# Patient Record
Sex: Female | Born: 1960 | Race: Black or African American | Hispanic: No | Smoking: Former smoker
Health system: Southern US, Community
[De-identification: ages and names within clinical notes are randomized; demographics above are authoritative.]

## PROBLEM LIST (undated history)

## (undated) DIAGNOSIS — N200 Calculus of kidney: Secondary | ICD-10-CM

## (undated) DIAGNOSIS — R739 Hyperglycemia, unspecified: Secondary | ICD-10-CM

## (undated) DIAGNOSIS — I493 Ventricular premature depolarization: Secondary | ICD-10-CM

## (undated) DIAGNOSIS — E119 Type 2 diabetes mellitus without complications: Secondary | ICD-10-CM

## (undated) DIAGNOSIS — I10 Essential (primary) hypertension: Secondary | ICD-10-CM

## (undated) DIAGNOSIS — R87619 Unspecified abnormal cytological findings in specimens from cervix uteri: Secondary | ICD-10-CM

## (undated) DIAGNOSIS — K219 Gastro-esophageal reflux disease without esophagitis: Secondary | ICD-10-CM

## (undated) DIAGNOSIS — Z87442 Personal history of urinary calculi: Secondary | ICD-10-CM

## (undated) DIAGNOSIS — Z720 Tobacco use: Secondary | ICD-10-CM

## (undated) DIAGNOSIS — I471 Supraventricular tachycardia: Secondary | ICD-10-CM

## (undated) HISTORY — DX: Gastro-esophageal reflux disease without esophagitis: K21.9

## (undated) HISTORY — DX: Essential (primary) hypertension: I10

## (undated) HISTORY — DX: Tobacco use: Z72.0

## (undated) HISTORY — DX: Calculus of kidney: N20.0

## (undated) HISTORY — PX: ABLATION: SHX5711

## (undated) HISTORY — DX: Hyperglycemia, unspecified: R73.9

## (undated) HISTORY — DX: Supraventricular tachycardia: I47.1

## (undated) HISTORY — DX: Unspecified abnormal cytological findings in specimens from cervix uteri: R87.619

## (undated) HISTORY — DX: Ventricular premature depolarization: I49.3

---

## 1998-04-16 ENCOUNTER — Other Ambulatory Visit: Admission: RE | Admit: 1998-04-16 | Discharge: 1998-04-16 | Payer: Self-pay | Admitting: Obstetrics and Gynecology

## 1999-05-06 ENCOUNTER — Other Ambulatory Visit: Admission: RE | Admit: 1999-05-06 | Discharge: 1999-05-06 | Payer: Self-pay | Admitting: Obstetrics and Gynecology

## 2002-01-31 ENCOUNTER — Encounter: Admission: RE | Admit: 2002-01-31 | Discharge: 2002-01-31 | Payer: Self-pay | Admitting: Family Medicine

## 2002-07-11 ENCOUNTER — Encounter: Admission: RE | Admit: 2002-07-11 | Discharge: 2002-07-11 | Payer: Self-pay | Admitting: Family Medicine

## 2002-07-11 ENCOUNTER — Other Ambulatory Visit: Admission: RE | Admit: 2002-07-11 | Discharge: 2002-07-11 | Payer: Self-pay | Admitting: Family Medicine

## 2004-04-04 ENCOUNTER — Ambulatory Visit: Payer: Self-pay | Admitting: Internal Medicine

## 2004-04-11 ENCOUNTER — Ambulatory Visit: Payer: Self-pay | Admitting: Internal Medicine

## 2005-11-12 ENCOUNTER — Ambulatory Visit (HOSPITAL_COMMUNITY): Admission: RE | Admit: 2005-11-12 | Discharge: 2005-11-12 | Payer: Self-pay | Admitting: Obstetrics and Gynecology

## 2006-10-26 ENCOUNTER — Emergency Department (HOSPITAL_COMMUNITY): Admission: EM | Admit: 2006-10-26 | Discharge: 2006-10-26 | Payer: Self-pay | Admitting: *Deleted

## 2010-07-04 NOTE — Op Note (Signed)
NAME:  Raven Weaver, Raven Weaver          ACCOUNT NO.:  [id number]   MEDICAL RECORD NO.:  [id number]          PATIENT TYPE:  AMB   LOCATION:  SDC                           FACILITY:  [facility]   PHYSICIAN:  Malva Limes, M.D.    DATE OF BIRTH:  October 13, 1960   DATE OF PROCEDURE:  11/12/2005  DATE OF DISCHARGE:                                 OPERATIVE REPORT   PREOPERATIVE DIAGNOSIS:  Menorrhagia.   POSTOPERATIVE DIAGNOSIS:  Menorrhagia.   PROCEDURE:  NovaSure endometrial ablation.   SURGEON:  Malva Limes, M.D.   ANESTHESIA:  General.   ANTIBIOTICS:  Ancef 1 g.   ESTIMATED BLOOD LOSS:  Minimal.   SPECIMENS:  None.   DRAINS:  Red rubber catheter to the bladder.   COMPLICATIONS:  None.   PROCEDURE:  The patient was taken to the operating room, and she was placed  in dorsal supine position.  A general anesthetic was administered without  complications.  She was then placed in dorsal lithotomy position.  She was  prepped with Betadine and her bladder drained with a red rubber catheter.  An exam under anesthesia revealed a retroverted uterus of normal size and  shape.  At this point a single-tooth tenaculum was applied to the anterior  cervical lip.  Lidocaine 1% 20 mL was used for paracervical block.  The  cervical os was serially dilated.  The uterus was sounded to 9 cm.  The  cervical length was 3 cm, giving a cavitary length of 6 cm.  At this point  the device was placed and opened.  The width was 4.2 cm.  The seal test was  performed and passed.  The device was then turned on.  The patient tolerated  the procedure well.  The device was removed.  The patient was taken to the  recovery room in stable condition.  Instrument and lap counts were correct  x1.  The patient will be discharged to home.  She will follow up in the  office in 4 weeks, and she will be given Percocet to take p.r.n.           ______________________________  Malva Limes, M.D.     MA/MEDQ  D:  11/12/2005  T:   11/14/2005  Job:  [id number]

## 2011-01-17 DEATH — deceased

## 2014-08-07 ENCOUNTER — Encounter: Payer: Self-pay | Admitting: Family Medicine

## 2014-08-07 ENCOUNTER — Ambulatory Visit (INDEPENDENT_AMBULATORY_CARE_PROVIDER_SITE_OTHER): Payer: Managed Care, Other (non HMO) | Admitting: Family Medicine

## 2014-08-07 VITALS — BP 138/84 | HR 100 | Temp 98.6°F | Wt 173.3 lb

## 2014-08-07 DIAGNOSIS — Z23 Encounter for immunization: Secondary | ICD-10-CM | POA: Diagnosis not present

## 2014-08-07 DIAGNOSIS — K219 Gastro-esophageal reflux disease without esophagitis: Secondary | ICD-10-CM

## 2014-08-07 DIAGNOSIS — Z720 Tobacco use: Secondary | ICD-10-CM

## 2014-08-07 DIAGNOSIS — J302 Other seasonal allergic rhinitis: Secondary | ICD-10-CM

## 2014-08-07 DIAGNOSIS — C189 Malignant neoplasm of colon, unspecified: Secondary | ICD-10-CM | POA: Diagnosis not present

## 2014-08-07 DIAGNOSIS — F17200 Nicotine dependence, unspecified, uncomplicated: Secondary | ICD-10-CM

## 2014-08-07 HISTORY — DX: Other seasonal allergic rhinitis: J30.2

## 2014-08-07 NOTE — Addendum Note (Signed)
Addended by: Agnes Lawrence on: 08/07/2014 03:04 PM   Modules accepted: Orders

## 2014-08-07 NOTE — Patient Instructions (Addendum)
BEFORE YOU LEAVE: -schedule physical with pap in about 3 months - come fasting (can have water and black coffee) -Tdap  Call and schedule mammogram  -We placed a referral for you as discussed for the colonoscopy. It usually takes about 1-2 weeks to process and schedule this referral. If you have not heard from [place] regarding this appointment in 2 weeks please contact our office.  We recommend the following healthy lifestyle measures: - eat a healthy diet consisting of lots of vegetables, fruits, beans, nuts, seeds, healthy meats such as white chicken and fish and whole grains.  - avoid fried foods, fast food, processed foods, sodas, red meet and other fattening foods.  - get a least 150 minutes of aerobic exercise per week.   Please quit smoking

## 2014-08-07 NOTE — Progress Notes (Signed)
Pre visit review using our clinic review tool, if applicable. No additional management support is needed unless otherwise documented below in the visit note. 

## 2014-08-07 NOTE — Progress Notes (Signed)
HPI:  Raven Weaver is here to establish care. Used to see Dr. Burnice Logan but has been > 3 years. Daughter sees me. Mother sees Dr. Raliegh Ip. Last PCP and physical: has not had a physical in a long time. She used to see gyn, but prefers to schedule her pap hear. Hx remote abn pap 25 years ago, all normal since per her report.  Has the following chronic problems that require follow up and concerns today:  Seasonal Allergies: -occ itchy eyes, uses OTC treatments -no hx asthma, allergy shots -reports improving as she gets older  GERD: -rare flares a few times per year -uses OTC prilosec occ -denies: hematochezia, melena, diarrhea, nausea, vomiting, constipation, dysphagia  Tobacco use: - 3-4 cigs per day for off and on for 10 years -denies: hx of cough, SOB, hemoptysis  HM: -colon: agreed to do, referral placed -mammogram: number provided to cal, has been some time -Tdap: will do today  ROS negative for unless reported above: fevers, unintentional weight loss, hearing or vision loss, chest pain, palpitations, struggling to breath, hemoptysis, melena, hematochezia, hematuria, falls, loc, si, thoughts of self harm  Past Medical History  Diagnosis Date  . GERD (gastroesophageal reflux disease)   . Kidney stones     remote, > 30 years ago  . Tobacco use   . Abnormal Pap smear of cervix     remote, > 25 years ago, s/p cone bxs, all normal since per her report  . Hyperglycemia     gestational diabetes    Past Surgical History  Procedure Laterality Date  . Cesarean section    . Ablation      Family History  Problem Relation Age of Onset  . Hypertension Mother   . Diabetes Mother   . Heart disease Mother 43    MI  . Cancer Maternal Aunt   . Diabetes Maternal Grandfather   . Hypertension Maternal Grandfather   . Cancer Paternal Grandmother     History   Social History  . Marital Status: Married    Spouse Name: N/A  . Number of Children: N/A  . Years of  Education: N/A   Social History Main Topics  . Smoking status: Current Every Day Smoker  . Smokeless tobacco: Not on file  . Alcohol Use: No     Comment: 1 glass every 2 weeks  . Drug Use: Not on file  . Sexual Activity: Not on file   Other Topics Concern  . None   Social History Narrative   Work or School: full time for [employer] Situation: lives with mom and kids home during the summer      Spiritual Beliefs: Christian      Lifestyle: walks 2-3 days per week; diet is fair per her report - avoid fried foods - tries to eat veggies and lean meat          No current outpatient prescriptions on file.  EXAM:  Filed Vitals:   08/07/14 1423  BP: 138/84  Pulse: 100  Temp: 98.6 F (37 C)    There is no height on file to calculate BMI.  GENERAL: vitals reviewed and listed above, alert, oriented, appears well hydrated and in no acute distress  HEENT: atraumatic, conjunttiva clear, no obvious abnormalities on inspection of external nose and ears  NECK: no obvious masses on inspection  LUNGS: clear to auscultation bilaterally, no wheezes, rales or rhonchi, good air movement  CV: HRRR, no peripheral  edema  MS: moves all extremities without noticeable abnormality  PSYCH: pleasant and cooperative, no obvious depression or anxiety  ASSESSMENT AND PLAN:  Discussed the following assessment and plan:  Seasonal allergies -seasonal allergies  Colon cancer - Plan: Ambulatory referral to Gastroenterology  Gastroesophageal reflux disease, esophagitis presence not specified -lifestyle changes, prn OTC treatment  Tobacco use disorder  -counseled to quit, information provided  -We reviewed the PMH, PSH, FH, SH, Meds and Allergies. -We provided refills for any medications we will prescribe as needed. -We addressed current concerns per orders and patient instructions. -We have asked for records for pertinent exams, studies, vaccines and notes from previous  providers. -We have advised patient to follow up per instructions below.   -Patient advised to return or notify a doctor immediately if symptoms worsen or persist or new concerns arise.  Patient Instructions  BEFORE YOU LEAVE: -schedule physical with pap in about 3 months - come fasting (can have water and black coffee) -Tdap  Call and schedule mammogram  -We placed a referral for you as discussed for the colonoscopy. It usually takes about 1-2 weeks to process and schedule this referral. If you have not heard from [place] regarding this appointment in 2 weeks please contact our office.  We recommend the following healthy lifestyle measures: - eat a healthy diet consisting of lots of vegetables, fruits, beans, nuts, seeds, healthy meats such as white chicken and fish and whole grains.  - avoid fried foods, fast food, processed foods, sodas, red meet and other fattening foods.  - get a least 150 minutes of aerobic exercise per week.   Please quit smoking     Hicks Feick R.

## 2014-10-25 ENCOUNTER — Encounter: Payer: Self-pay | Admitting: Gastroenterology

## 2014-11-28 ENCOUNTER — Ambulatory Visit (INDEPENDENT_AMBULATORY_CARE_PROVIDER_SITE_OTHER): Payer: Managed Care, Other (non HMO) | Admitting: Family Medicine

## 2014-11-28 ENCOUNTER — Ambulatory Visit (AMBULATORY_SURGERY_CENTER): Payer: Self-pay | Admitting: *Deleted

## 2014-11-28 ENCOUNTER — Encounter: Payer: Self-pay | Admitting: Family Medicine

## 2014-11-28 VITALS — Ht 64.0 in | Wt 178.0 lb

## 2014-11-28 VITALS — BP 122/78 | HR 90 | Temp 98.4°F | Ht 64.0 in | Wt 177.5 lb

## 2014-11-28 DIAGNOSIS — F17200 Nicotine dependence, unspecified, uncomplicated: Secondary | ICD-10-CM | POA: Diagnosis not present

## 2014-11-28 DIAGNOSIS — Z0000 Encounter for general adult medical examination without abnormal findings: Secondary | ICD-10-CM | POA: Diagnosis not present

## 2014-11-28 DIAGNOSIS — Z1211 Encounter for screening for malignant neoplasm of colon: Secondary | ICD-10-CM

## 2014-11-28 DIAGNOSIS — E669 Obesity, unspecified: Secondary | ICD-10-CM

## 2014-11-28 MED ORDER — NA SULFATE-K SULFATE-MG SULF 17.5-3.13-1.6 GM/177ML PO SOLN: ORAL | Status: DC

## 2014-11-28 NOTE — Progress Notes (Signed)
HPI:  Here for CPE:  -Concerns and/or follow up today:   Seasonal Allergies: -occ itchy eyes, uses OTC treatments -no hx asthma, allergy shots -reports improving as she gets older  GERD: -rare flares a few times per year -uses OTC prilosec occ -denies: hematochezia, melena, diarrhea, nausea, vomiting, constipation, dysphagia  Tobacco use: - 3-4 cigs per day for off and on for 10 years -denies: hx of cough, SOB, hemoptysis  -Diet: variety of foods, balance and well rounded  -Exercise: walking 3 days per week  -Taking folic acid, vitamin D or calcium: no  -Diabetes and Dyslipidemia Screening: she had cholesterol and diabetes screening at work today  -Hx of HTN: no - reports bp always normal  -Vaccines: UTD - declined flu shot  -pap history: reports just did pap and gyn physical and mammo with Dr. Benjie Karvonen in gyn 2 months ago  -sexual activity: no  -wants STI testing (Hep C if born [birth year]): no  -FH breast, colon or ovarian ca: see FH Last mammogram:done per her report with Dr. Benjie Karvonen Last colon cancer screening: reports she is schedule for her colonoscopy  Did her breast and womens health exam a few months ago with Dr. Benjie Karvonen   -Alcohol, Tobacco, drug use: see social history  Review of Systems - no fevers, unintentional weight loss, vision loss, hearing loss, chest pain, sob, hemoptysis, melena, hematochezia, hematuria, genital discharge, changing or concerning skin lesions, bleeding, bruising, loc, thoughts of self harm or SI  Past Medical History  Diagnosis Date  . GERD (gastroesophageal reflux disease)   . Kidney stones     remote, > 30 years ago  . Tobacco use   . Abnormal Pap smear of cervix     remote, > 25 years ago, s/p cone bxs, all normal since per her report  . Hyperglycemia     gestational diabetes    Past Surgical History  Procedure Laterality Date  . Cesarean section    . Ablation      Family History  Problem Relation Age of Onset  .  Hypertension Mother   . Diabetes Mother   . Heart disease Mother 26    MI  . Cancer Maternal Aunt   . Diabetes Maternal Grandfather   . Hypertension Maternal Grandfather   . Cancer Paternal Grandmother     Social History   Social History  . Marital Status: Married    Spouse Name: N/A  . Number of Children: N/A  . Years of Education: N/A   Social History Main Topics  . Smoking status: Current Every Day Smoker  . Smokeless tobacco: None  . Alcohol Use: No     Comment: 1 glass every 2 weeks  . Drug Use: None  . Sexual Activity: Not Asked   Other Topics Concern  . None   Social History Narrative   Work or School: full time for [employer] Situation: lives with mom and kids home during the summer      Spiritual Beliefs: Christian      Lifestyle: walks 2-3 days per week; diet is fair per her report - avoid fried foods - tries to eat veggies and lean meat          No current outpatient prescriptions on file.  EXAMDanley Danker Vitals:   11/28/14 0849  BP: 122/78  Pulse: 90  Temp: 98.4 F (36.9 C)   Body mass index is 30.45 kg/(m^2).  GENERAL: vitals reviewed and listed below, alert,  oriented, appears well hydrated and in no acute distress  HEENT: head atraumatic, PERRLA, normal appearance of eyes, ears, nose and mouth. moist mucus membranes.  NECK: supple, no masses or lymphadenopathy  LUNGS: clear to auscultation bilaterally, no rales, rhonchi or wheeze  CV: HRRR, no peripheral edema or cyanosis, normal pedal pulses  BREAST: declined  ABDOMEN: bowel sounds normal, soft, non tender to palpation, no masses, no rebound or guarding  GU: declined  SKIN: no rash or abnormal lesions  MS: normal gait, moves all extremities normally  NEURO: CN II-XII grossly intact, normal muscle strength and sensation to light touch on extremities  PSYCH: normal affect, pleasant and cooperative  ASSESSMENT AND PLAN:  Discussed the following assessment and  plan:   Visit for preventive health examination  Tobacco use disorder -counseled 3-5 minutes  Obesity -counseled, lifestyle recs  -she reports colonoscopy already scheduled  -Discussed and advised all [place] preventive services health task force level A and B recommendations for age, sex and risks.  -Advised at least 150 minutes of exercise per week and a healthy diet low in saturated fats and sweets and consisting of fresh fruits and vegetables, lean meats such as fish and white chicken and whole grains.  -labs, studies and vaccines per orders this encounter  No orders of the defined types were placed in this encounter.    Patient advised to return to clinic immediately if symptoms worsen or persist or new concerns.  Patient Instructions  BEFORE YOU LEAVE: -schedule follow up in 6 months  Please fax [place] a copy of your labwork  Please quit smoking - call the quitline and try nicotine lozenges or gum for cravings  We recommend the following healthy lifestyle measures: - eat a healthy whole foods diet consisting of regular small meals composed of vegetables, fruits, beans, nuts, seeds, healthy meats such as white chicken and fish and whole grains.  - avoid sweets, white starchy foods, fried foods, fast food, processed foods, sodas, red meet and other fattening foods.  - get a least 150-300 minutes of aerobic exercise per week.      Return in about 1 year (around 11/28/2015).  Colin Benton R.

## 2014-11-28 NOTE — Patient Instructions (Signed)
BEFORE YOU LEAVE: -schedule follow up in 6 months  Please fax [place] a copy of your labwork  Please quit smoking - call the quitline and try nicotine lozenges or gum for cravings  We recommend the following healthy lifestyle measures: - eat a healthy whole foods diet consisting of regular small meals composed of vegetables, fruits, beans, nuts, seeds, healthy meats such as white chicken and fish and whole grains.  - avoid sweets, white starchy foods, fried foods, fast food, processed foods, sodas, red meet and other fattening foods.  - get a least 150-300 minutes of aerobic exercise per week.

## 2014-11-28 NOTE — Progress Notes (Signed)
Pre visit review using our clinic review tool, if applicable. No additional management support is needed unless otherwise documented below in the visit note. 

## 2014-11-28 NOTE — Progress Notes (Signed)
Patient denies any allergies to eggs or soy. Patient denies any problems with anesthesia/sedation. Patient denies any oxygen use at home and does not take any diet/weight loss medications. EMMI education assisgned to patient on colonoscopy, this was explained and instructions given to patient. 

## 2014-12-12 ENCOUNTER — Ambulatory Visit: Payer: Managed Care, Other (non HMO) | Admitting: Gastroenterology

## 2014-12-12 ENCOUNTER — Encounter: Payer: Self-pay | Admitting: Family Medicine

## 2014-12-12 ENCOUNTER — Ambulatory Visit (INDEPENDENT_AMBULATORY_CARE_PROVIDER_SITE_OTHER): Payer: Managed Care, Other (non HMO) | Admitting: Family Medicine

## 2014-12-12 ENCOUNTER — Encounter: Payer: Self-pay | Admitting: Gastroenterology

## 2014-12-12 VITALS — BP 141/68 | HR 93 | Temp 97.6°F | Ht 64.0 in | Wt 178.0 lb

## 2014-12-12 VITALS — BP 133/76 | HR 60 | Temp 98.8°F | Ht 64.0 in | Wt 177.0 lb

## 2014-12-12 DIAGNOSIS — I499 Cardiac arrhythmia, unspecified: Secondary | ICD-10-CM

## 2014-12-12 DIAGNOSIS — Z1211 Encounter for screening for malignant neoplasm of colon: Secondary | ICD-10-CM

## 2014-12-12 LAB — BASIC METABOLIC PANEL WITH GFR
BUN: 10 mg/dL (ref 6–23)
CO2: 30 meq/L (ref 19–32)
Calcium: 10 mg/dL (ref 8.4–10.5)
Chloride: 106 meq/L (ref 96–112)
Creatinine, Ser: 0.88 mg/dL (ref 0.40–1.20)
GFR: 85.55 mL/min
Glucose, Bld: 168 mg/dL — ABNORMAL HIGH (ref 70–99)
Potassium: 4.3 meq/L (ref 3.5–5.1)
Sodium: 143 meq/L (ref 135–145)

## 2014-12-12 LAB — CBC WITH DIFFERENTIAL/PLATELET
Basophils Absolute: 0 10*3/uL (ref 0.0–0.1)
Basophils Relative: 0.2 % (ref 0.0–3.0)
Eosinophils Absolute: 0.2 10*3/uL (ref 0.0–0.7)
Eosinophils Relative: 1.2 % (ref 0.0–5.0)
HCT: 45.8 % (ref 36.0–46.0)
Hemoglobin: 15.3 g/dL — ABNORMAL HIGH (ref 12.0–15.0)
Lymphocytes Relative: 40.7 % (ref 12.0–46.0)
Lymphs Abs: 5.7 10*3/uL — ABNORMAL HIGH (ref 0.7–4.0)
MCHC: 33.4 g/dL (ref 30.0–36.0)
MCV: 90.9 fl (ref 78.0–100.0)
Monocytes Absolute: 0.9 10*3/uL (ref 0.1–1.0)
Monocytes Relative: 6.2 % (ref 3.0–12.0)
Neutro Abs: 7.2 10*3/uL (ref 1.4–7.7)
Neutrophils Relative %: 51.7 % (ref 43.0–77.0)
Platelets: 322 10*3/uL (ref 150.0–400.0)
RBC: 5.04 Mil/uL (ref 3.87–5.11)
RDW: 13.8 % (ref 11.5–15.5)
WBC: 13.9 10*3/uL — ABNORMAL HIGH (ref 4.0–10.5)

## 2014-12-12 LAB — TSH: TSH: 0.58 u[IU]/mL (ref 0.35–4.50)

## 2014-12-12 LAB — MAGNESIUM: Magnesium: 2.3 mg/dL (ref 1.5–2.5)

## 2014-12-12 MED ORDER — SODIUM CHLORIDE 0.9 % IV SOLN: 500.0000 mL | INTRAVENOUS | Status: DC

## 2014-12-12 NOTE — Progress Notes (Signed)
   Subjective:    Patient ID: Raven Weaver, female    DOB: May 08, 1960, 54 y.o.   MRN: [id number]  HPI Here at the request of the GI clinic to recheck an irregular heartbeat. She has no hx of heart problems and in fact was here to see Dr. Maudie Mercury just 2 weeks ago with a normal heart exam. She denies any symptoms like chest pain or SOB or palpitations. She was planned to have a colonoscopy this am, and she went NPO for 24 hours from Monday night to Tuesday night. Then last night she took the oral prep as prescribed. She felt fine when she got to the GI clinic this am, but the staff noticed that she had an irregular heartbeat. An EKG confirmed that she was in trigeminy with frequent ectopy. The procedure was cancelled, and she was sent to see [place]. Right now she still feels fine and has no symptoms at all.    Review of Systems  Constitutional: Negative.   Respiratory: Negative.   Cardiovascular: Negative.   Endocrine: Negative.   Neurological: Negative.        Objective:   Physical Exam  Constitutional: She is oriented to person, place, and time. She appears well-developed and well-nourished. No distress.  Neck: No thyromegaly present.  Cardiovascular: Normal rate, normal heart sounds and intact distal pulses.  Exam reveals no gallop and no friction rub.   No murmur heard. Irregular with frequent ectopy . EKG still shows trigeminy   Pulmonary/Chest: Effort normal and breath sounds normal. No respiratory distress. She has no wheezes. She has no rales.  Lymphadenopathy:    She has no cervical adenopathy.  Neurological: She is alert and oriented to person, place, and time.          Assessment & Plan:  New onset of trigeminy in an asymptomatic patient. It is highly likely that her pre-colonoscopy prep has caused an electolyte imbalance (probably a low potassium) and this has precipitated the arrythmia. She will now have labs drawn including a BMET. I advised her to go home to rest and to  drink plenty of Gatorade. We will refer her to Cardiology as well.

## 2014-12-12 NOTE — Progress Notes (Signed)
Pre visit review using our clinic review tool, if applicable. No additional management support is needed unless otherwise documented below in the visit note. 

## 2014-12-12 NOTE — Progress Notes (Signed)
Patient here for screening colonoscopy today. During her check-in / pre-op she was noted to be in ventricular trigeminy with frequent PVCs. She is asymptomatic. No chest pain or palpations, no lightheadedness. She denies a history of PVCs or arrhythmias. Discussed with anesthesia. Recommend she have this evaluated by primary care / cardiology prior to proceeding with anesthesia to avoid precipitation of an arrhythmia. The patient's PCM was contacted and she will be seen today for this issue. She will be rescheduled to have this done at another time when this issue has been evaluated. She agreed with the plan.

## 2014-12-12 NOTE — Progress Notes (Signed)
Patient denies any allergies to eggs or soy. 

## 2014-12-12 NOTE — Progress Notes (Signed)
Late entry:  Pt noted to have ventricular trigeminy upon admission to the [facility].  She denies chest pain, SOB, feelings of palpitations.  No cardiac history noted.  Pt seen by Jeraldine Loots CRNA and Dr. Havery Moros.  It is decided to cancel her colonoscopy, for her to see her PCP to determine if cardiology consult needed and then reschedule procedure at pt's convenience.  Pt is agreeable to this.    appt made with Dr. Sarajane Jews at 2:00.  Pt and mother aware.

## 2014-12-14 ENCOUNTER — Telehealth: Payer: Self-pay | Admitting: Family Medicine

## 2014-12-14 NOTE — Telephone Encounter (Signed)
Pt needs blood work results °

## 2014-12-14 NOTE — Progress Notes (Signed)
Received labs from her work. Hgba1c 7.0, cholesterol ratio 3.3 with LDL 108. Advised assistant to contact pt to schedule appt to recheck and discuss in next 1 month.

## 2014-12-14 NOTE — Telephone Encounter (Signed)
Patient informed of the results and appt scheduled for 11/22.

## 2014-12-14 NOTE — Telephone Encounter (Signed)
Left a message for the pt to return my call.  

## 2014-12-25 NOTE — Progress Notes (Signed)
Cardiology Office Note   Date:  12/26/2014   ID:  DEYJAH KINDEL, DOB [birth year], MRN [id number]  PCP:  [name]., DO  Cardiologist:   Sharol Harness, MD   Chief Complaint  Patient presents with  . New Evaluation    irregular heartbeat  . Chest Pain    pt c/o having some palpatatiions 2 weeks ago and some tingling in fingers  . Shortness of Breath    no SOB  . Edema    no edema      History of Present Illness: SANTANA GOSDIN is a 54 y.o. female with tobacco abuse and diabetes who presents for an evaluation of palpitations.  Ms. Sobieski presented for colonoscopy on 10/26 where she was noted to have an irregular heart beat.  EKG showed ventricular trigeminy with frequent PVCs.  Her procedure was cancelled and she was referred to cardiology.  She saw her PCP, Dr. Alysia Penna, later that day.  At that time her thyroid function was normal, as was her renal function and electrolytes.  However, her WBC was 16.3 and hemoglobin A1c was 7%.  Prior to this she never knew that she had an irregular heart beat.  She has not noted any palpitations or chest pain.  Since that time she has noted intermittent palpitations. Initially it was sporadic but has has started to be more constant.  The episodes last up to 30 seconds and they are not associated with shortness of breath.  There is no associated lightheadedness or dizziness.   Ms. Byrer has a history of indigestion but has noted increased heart burn over the last couple of days. She has tried taking Tums which helps.  Episodes occur up to multiple times in the day or may not happen for weeks at a time.  She has also noted tingling in the L 4th and 5h digits.    Ms. Fulp walks once or twice per week.  She deneis chest pain, or SOB, diaphoresis with this activity. She also denies lightheadedness.  She reports that her diet is pretty good. She eats good portions and drinks mostly water. She drinks wine 1-2 times per week.  She quit smoking last week.   Past Medical History  Diagnosis Date  . GERD (gastroesophageal reflux disease)   . Kidney stones     remote, > 30 years ago  . Tobacco use   . Abnormal Pap smear of cervix     remote, > 25 years ago, s/p cone bxs, all normal since per her report  . Hyperglycemia     gestational diabetes  . SVT (supraventricular tachycardia) ([place]) 12/26/2014    Past Surgical History  Procedure Laterality Date  . Cesarean section    . Ablation      uterine     Current Outpatient Prescriptions  Medication Sig Dispense Refill  . aspirin EC 81 MG tablet Take 1 tablet (81 mg total) by mouth daily. 90 tablet 3  . esomeprazole (NEXIUM) 40 MG capsule Take 1 capsule (40 mg total) by mouth daily at 12 noon. 30 capsule 6  . nitroGLYCERIN (NITROSTAT) 0.4 MG SL tablet Place 1 tablet (0.4 mg total) under the tongue every 5 (five) minutes as needed for chest pain. 25 tablet 6   No current facility-administered medications for this visit.    Allergies:   Review of patient's allergies indicates no known allergies.    Social History:  The patient  reports that she has been smoking  Cigarettes.  She has been smoking about 0.25 packs per day. She has never used smokeless tobacco. She reports that she drinks alcohol. She reports that she does not use illicit drugs.   Family History:  The patient's family history includes Cancer in her maternal aunt and paternal grandmother; Diabetes in her maternal grandfather and mother; Heart disease (age of onset: 33) in her mother; Hypertension in her maternal grandfather and mother. There is no history of Colon cancer.    ROS:  Please see the history of present illness.   Otherwise, review of systems are positive for none.   All other systems are reviewed and negative.    PHYSICAL EXAM: VS:  BP 152/64 mmHg  Pulse 85  Ht 5\' 4"  (1.626 m)  Wt 80.377 kg (177 lb 3.2 oz)  BMI 30.40 kg/m2  LMP 02/16/2006 , BMI Body mass index is 30.4  kg/(m^2). GENERAL:  Well appearing HEENT:  Pupils equal round and reactive, fundi not visualized, oral mucosa unremarkable NECK:  No jugular venous distention, waveform within normal limits, carotid upstroke brisk and symmetric, no bruits, no thyromegaly LYMPHATICS:  No cervical adenopathy LUNGS:  Clear to auscultation bilaterally HEART: Mostly regular with frequent ectopy.  PMI not displaced or sustained,S1 and S2 within normal limits, no S3, no S4, no clicks, no rubs, no murmurs ABD:  Flat, positive bowel sounds normal in frequency in pitch, no bruits, no rebound, no guarding, no midline pulsatile mass, no hepatomegaly, no splenomegaly EXT:  2 plus pulses throughout, no edema, no cyanosis no clubbing SKIN:  No rashes no nodules NEURO:  Cranial nerves II through XII grossly intact, motor grossly intact throughout PSYCH:  Cognitively intact, oriented to person place and time    EKG:  EKG is not ordered today.   Recent Labs: 12/12/2014: BUN 10; Creatinine, Ser 0.88; Hemoglobin 15.3*; Magnesium 2.3; Platelets 322.0; Potassium 4.3; Sodium 143; TSH 0.58    Lipid Panel No results found for: CHOL, TRIG, HDL, CHOLHDL, VLDL, LDLCALC, LDLDIRECT    Wt Readings from Last 3 Encounters:  12/26/14 80.377 kg (177 lb 3.2 oz)  12/12/14 80.287 kg (177 lb)  12/12/14 80.74 kg (178 lb)      ASSESSMENT AND PLAN:  # PVCs: Ms. Rotenberg PVCs are not particularly troubling to her. She does not have any evidence of heart failure on exam. Her electrolytes are within normal limits. It is concerning that this could represent ischemia, especially in light of the indigestion and chest discomfort that she has been having recently. She is planning to go out of town and cannot undergo an ischemia evaluation this week. We will start her on aspirin and as needed nitroglycerin. We will refer her for an exercise Myoview. In the interim, she will also start omeprazole 20 mg by mouth daily. This will help determine how  much of her symptoms are due to gastroesophageal reflux disease versus potential ischemia.  Finally, we will obtain a 24-hour Holter to quantify the frequency of her PVCs.  # Tobacco abuse: Ms. Khaimov was congratulated on recently quitting smoking. She was encouraged to keep these efforts.  # Elevated blood pressure:  Ms. Milberger blood pressure is elevated today. She reports that it typically is well-controlled, and that it is just elevated because she is nervous and stressed about her heart. We will continue to monitor this and start antihypertensives as necessary.   Current medicines are reviewed at length with the patient today.  The patient does not have concerns regarding medicines.  The  following changes have been made:  no change  Labs/ tests ordered today include:   Orders Placed This Encounter  Procedures  . Holter monitor - 24 hour  . Myocardial Perfusion Imaging     Disposition:   FU with Pepper Kerrick C. [name], MD after stress   Signed, Sharol Harness, MD  12/26/2014 10:25 PM    [place]

## 2014-12-26 ENCOUNTER — Encounter: Payer: Self-pay | Admitting: Cardiovascular Disease

## 2014-12-26 ENCOUNTER — Telehealth: Payer: Self-pay | Admitting: *Deleted

## 2014-12-26 ENCOUNTER — Ambulatory Visit (INDEPENDENT_AMBULATORY_CARE_PROVIDER_SITE_OTHER): Payer: Managed Care, Other (non HMO) | Admitting: Cardiovascular Disease

## 2014-12-26 VITALS — BP 152/64 | HR 85 | Ht 64.0 in | Wt 177.2 lb

## 2014-12-26 DIAGNOSIS — I493 Ventricular premature depolarization: Secondary | ICD-10-CM

## 2014-12-26 DIAGNOSIS — I471 Supraventricular tachycardia, unspecified: Secondary | ICD-10-CM | POA: Insufficient documentation

## 2014-12-26 DIAGNOSIS — R072 Precordial pain: Secondary | ICD-10-CM

## 2014-12-26 DIAGNOSIS — R002 Palpitations: Secondary | ICD-10-CM | POA: Diagnosis not present

## 2014-12-26 HISTORY — DX: Ventricular premature depolarization: I49.3

## 2014-12-26 HISTORY — DX: Supraventricular tachycardia: I47.1

## 2014-12-26 MED ORDER — OMEPRAZOLE 20 MG PO CPDR: 20.0000 mg | DELAYED_RELEASE_CAPSULE | Freq: Every day | ORAL | Status: DC

## 2014-12-26 MED ORDER — NITROGLYCERIN 0.4 MG SL SUBL: 0.4000 mg | SUBLINGUAL_TABLET | SUBLINGUAL | Status: DC | PRN

## 2014-12-26 MED ORDER — ESOMEPRAZOLE MAGNESIUM 40 MG PO CPDR: 40.0000 mg | DELAYED_RELEASE_CAPSULE | Freq: Every day | ORAL | Status: DC

## 2014-12-26 MED ORDER — ASPIRIN EC 81 MG PO TBEC: 81.0000 mg | DELAYED_RELEASE_TABLET | Freq: Every day | ORAL | Status: DC

## 2014-12-26 NOTE — Patient Instructions (Signed)
Start taking  Omeprazole 20 mg one tablet daily in the morning  Aspirin 81 mg enetric coated one tablet daily.  Your physician has requested that you have en exercise stress myoview. For further information please visit [email or web address]. Please follow instruction sheet, as given.  Will place on the day you have myoview.---Your physician has recommended that you wear a holter monitor. Holter monitors are medical devices that record the heart's electrical activity. Doctors most often use these monitors to diagnose arrhythmias. Arrhythmias are problems with the speed or rhythm of the heartbeat. The monitor is a small, portable device. You can wear one while you do your normal daily activities. This is usually used to diagnose what is causing palpitations/syncope (passing out).   Your physician wants you to follow-up after the test are completed.  .Nitroglycerin sublingual tablets What is this medicine? NITROGLYCERIN (nye troe GLI ser in) is a type of vasodilator. It relaxes blood vessels, increasing the blood and oxygen supply to your heart. This medicine is used to relieve chest pain caused by angina. It is also used to prevent chest pain before activities like climbing stairs, going outdoors in cold weather, or sexual activity. This medicine may be used for other purposes; ask your health care provider or pharmacist if you have questions. What should I tell my health care provider before I take this medicine? They need to know if you have any of these conditions: -anemia -head injury, recent stroke, or bleeding in the brain -liver disease -previous heart attack -an unusual or allergic reaction to nitroglycerin, other medicines, foods, dyes, or preservatives -pregnant or trying to get pregnant -breast-feeding How should I use this medicine? Take this medicine by mouth as needed. At the first sign of an angina attack (chest pain or tightness) place one tablet under your tongue. You can also  take this medicine 5 to 10 minutes before an event likely to produce chest pain. Follow the directions on the prescription label. Let the tablet dissolve under the tongue. Do not swallow whole. Replace the dose if you accidentally swallow it. It will help if your mouth is not dry. Saliva around the tablet will help it to dissolve more quickly. Do not eat or drink, smoke or chew tobacco while a tablet is dissolving. If you are not better within 5 minutes after taking ONE dose of nitroglycerin, call 9-1-1 immediately to seek emergency medical care. Do not take more than 3 nitroglycerin tablets over 15 minutes. If you take this medicine often to relieve symptoms of angina, your doctor or health care professional may provide you with different instructions to manage your symptoms. If symptoms do not go away after following these instructions, it is important to call 9-1-1 immediately. Do not take more than 3 nitroglycerin tablets over 15 minutes. Talk to your pediatrician regarding the use of this medicine in children. Special care may be needed. Overdosage: If you think you have taken too much of this medicine contact a poison control center or emergency room at once. NOTE: This medicine is only for you. Do not share this medicine with others. What if I miss a dose? This does not apply. This medicine is only used as needed. What may interact with this medicine? Do not take this medicine with any of the following medications: -certain migraine medicines like ergotamine and dihydroergotamine (DHE) -medicines used to treat erectile dysfunction like sildenafil, tadalafil, and vardenafil -riociguat This medicine may also interact with the following medications: -alteplase -aspirin -heparin -medicines for high blood  pressure -medicines for mental depression -other medicines used to treat angina -phenothiazines like chlorpromazine, mesoridazine, prochlorperazine, thioridazine This list may not describe all  possible interactions. Give your health care provider a list of all the medicines, herbs, non-prescription drugs, or dietary supplements you use. Also tell them if you smoke, drink alcohol, or use illegal drugs. Some items may interact with your medicine. What should I watch for while using this medicine? Tell your doctor or health care professional if you feel your medicine is no longer working. Keep this medicine with you at all times. Sit or lie down when you take your medicine to prevent falling if you feel dizzy or faint after using it. Try to remain calm. This will help you to feel better faster. If you feel dizzy, take several deep breaths and lie down with your feet propped up, or bend forward with your head resting between your knees. You may get drowsy or dizzy. Do not drive, use machinery, or do anything that needs mental alertness until you know how this drug affects you. Do not stand or sit up quickly, especially if you are an older patient. This reduces the risk of dizzy or fainting spells. Alcohol can make you more drowsy and dizzy. Avoid alcoholic drinks. Do not treat yourself for coughs, colds, or pain while you are taking this medicine without asking your doctor or health care professional for advice. Some ingredients may increase your blood pressure. What side effects may I notice from receiving this medicine? Side effects that you should report to your doctor or health care professional as soon as possible: -blurred vision -dry mouth -skin rash -sweating -the feeling of extreme pressure in the head -unusually weak or tired Side effects that usually do not require medical attention (report to your doctor or health care professional if they continue or are bothersome): -flushing of the face or neck -headache -irregular heartbeat, palpitations -nausea, vomiting This list may not describe all possible side effects. Call your doctor for medical advice about side effects. You may  report side effects to FDA at 1-800-FDA-1088. Where should I keep my medicine? Keep out of the reach of children. Store at room temperature between 20 and 25 degrees C (68 and 77 degrees F). Store in [facility] [employer]. Protect from light and moisture. Keep tightly closed. Throw away any unused medicine after the expiration date. NOTE: This sheet is a summary. It may not cover all possible information. If you have questions about this medicine, talk to your doctor, pharmacist, or health care provider.    2016, [employer]. (2012-12-01 17:57:36)

## 2014-12-26 NOTE — Telephone Encounter (Signed)
Patient called omeprazole is not  On formulary  Per Dr [name], MAY SWITCH TO NEXIUM 40 MG ONE DAILY.  PRESCRIPTION CALLED INTO PHARMACY\\PATIENT AWARE.

## 2015-01-08 ENCOUNTER — Ambulatory Visit (INDEPENDENT_AMBULATORY_CARE_PROVIDER_SITE_OTHER): Payer: Managed Care, Other (non HMO) | Admitting: Family Medicine

## 2015-01-08 ENCOUNTER — Encounter: Payer: Self-pay | Admitting: Family Medicine

## 2015-01-08 VITALS — BP 126/80 | HR 80 | Temp 98.6°F | Ht 64.0 in | Wt 176.7 lb

## 2015-01-08 DIAGNOSIS — E119 Type 2 diabetes mellitus without complications: Secondary | ICD-10-CM

## 2015-01-08 DIAGNOSIS — R030 Elevated blood-pressure reading, without diagnosis of hypertension: Secondary | ICD-10-CM | POA: Diagnosis not present

## 2015-01-08 DIAGNOSIS — E785 Hyperlipidemia, unspecified: Secondary | ICD-10-CM | POA: Diagnosis not present

## 2015-01-08 DIAGNOSIS — IMO0001 Reserved for inherently not codable concepts without codable children: Secondary | ICD-10-CM

## 2015-01-08 NOTE — Patient Instructions (Signed)
BEFORE YOU LEAVE: -schedule follow up in 3-4 months, come fasting  We recommend the following healthy lifestyle measures: - eat a healthy whole foods diet consisting of regular small meals composed of vegetables, fruits, beans, nuts, seeds, healthy meats such as white chicken and fish and whole grains.  - avoid sweets, white starchy foods, fried foods, fast food, processed foods, sodas, red meet and other fattening foods.  - get a least 150-300 minutes of aerobic exercise per week.

## 2015-01-08 NOTE — Progress Notes (Signed)
HPI:  Raven Weaver is a pleasant 54 yo whom is here for concerns of elevated Blood Sugar. She saw my collegue recently regarding and abnormal EKG prior to her colonscopy and some blood work was obtained. She saw cardiology last month and is scheduled for a perfusion study and holter. She had labs at work with a hbga1c of 7.0 and elevated cholesterol with LDL 108. She is quite distraught about this and is determined to commit to lifestyle changes and does not wish to take medications. Denies: CP, SOB, DOE, polyuria, polydipsia.  ROS: See pertinent positives and negatives per HPI.  Past Medical History  Diagnosis Date  . GERD (gastroesophageal reflux disease)   . Kidney stones     remote, > 30 years ago  . Tobacco use   . Abnormal Pap smear of cervix     remote, > 25 years ago, s/p cone bxs, all normal since per her report  . Hyperglycemia     gestational diabetes  . SVT (supraventricular tachycardia) ([place]) 12/26/2014  . PVC (premature ventricular contraction) 12/26/2014    Past Surgical History  Procedure Laterality Date  . Cesarean section    . Ablation      uterine    Family History  Problem Relation Age of Onset  . Hypertension Mother   . Diabetes Mother   . Heart disease Mother 43    MI  . CAD Mother   . Cancer Maternal Aunt   . Diabetes Maternal Grandfather   . Hypertension Maternal Grandfather   . Cancer Paternal Grandmother   . Colon cancer Neg Hx     Social History   Social History  . Marital Status: Married    Spouse Name: N/A  . Number of Children: N/A  . Years of Education: N/A   Social History Main Topics  . Smoking status: Former Smoker -- 0.25 packs/day    Types: Cigarettes    Quit date: 12/19/2014  . Smokeless tobacco: Never Used  . Alcohol Use: 0.0 oz/week    0 Standard drinks or equivalent per week     Comment: 1 glass every 2 weeks  . Drug Use: No  . Sexual Activity: Not Asked   Other Topics Concern  . None   Social History  Narrative   Work or School: full time for [employer] Situation: lives with mom and kids home during the summer      Spiritual Beliefs: Christian      Lifestyle: walks 2-3 days per week; diet is fair per her report - avoid fried foods - tries to eat veggies and lean meat           Current outpatient prescriptions:  .  aspirin EC 81 MG tablet, Take 1 tablet (81 mg total) by mouth daily., Disp: 90 tablet, Rfl: 3 .  esomeprazole (NEXIUM) 40 MG capsule, Take 1 capsule (40 mg total) by mouth daily at 12 noon., Disp: 30 capsule, Rfl: 6 .  nitroGLYCERIN (NITROSTAT) 0.4 MG SL tablet, Place 1 tablet (0.4 mg total) under the tongue every 5 (five) minutes as needed for chest pain., Disp: 25 tablet, Rfl: 6  EXAM:  Filed Vitals:   01/08/15 0907  BP: 126/80  Pulse: 80  Temp: 98.6 F (37 C)    Body mass index is 30.32 kg/(m^2).  GENERAL: vitals reviewed and listed above, alert, oriented, appears well hydrated and in no acute distress  HEENT: atraumatic, conjunttiva clear, no obvious abnormalities on inspection  of external nose and ears  NECK: no obvious masses on inspection  LUNGS: clear to auscultation bilaterally, no wheezes, rales or rhonchi, good air movement  CV: HRRR, no peripheral edema  MS: moves all extremities without noticeable abnormality  PSYCH: pleasant and cooperative, no obvious depression or anxiety  ASSESSMENT AND PLAN:  Discussed the following assessment and plan:  Type 2 diabetes mellitus without complication, without long-term current use of insulin (HCC)  Hyperlipemia  Elevated blood pressure  We discussed disease process, dx and treatment at  [facility]. She really wants to treat with lifestyle. Plans to start diet changes today and exercise once evaluation with cardiology complete. We recommend the following healthy lifestyle measures: - eat a healthy whole foods diet consisting of regular small meals composed of vegetables, fruits, beans, nuts, seeds,  healthy meats such as white chicken and fish and small portions whole grains.  - avoid sweets, white starchy foods, fried foods, fast food, processed foods, sodas, red meet and other fattening foods.  - get a least 150-300 minutes of aerobic exercise per week.   -Patient advised to return or notify a doctor immediately if symptoms worsen or persist or new concerns arise.  Patient Instructions  BEFORE YOU LEAVE: -schedule follow up in 3-4 months, come fasting  We recommend the following healthy lifestyle measures: - eat a healthy whole foods diet consisting of regular small meals composed of vegetables, fruits, beans, nuts, seeds, healthy meats such as white chicken and fish and whole grains.  - avoid sweets, white starchy foods, fried foods, fast food, processed foods, sodas, red meet and other fattening foods.  - get a least 150-300 minutes of aerobic exercise per week.       Colin Benton R.

## 2015-01-08 NOTE — Progress Notes (Signed)
Pre visit review using our clinic review tool, if applicable. No additional management support is needed unless otherwise documented below in the visit note. 

## 2015-01-09 ENCOUNTER — Telehealth (HOSPITAL_COMMUNITY): Payer: Self-pay

## 2015-01-09 NOTE — Telephone Encounter (Signed)
Encounter complete. 

## 2015-01-15 ENCOUNTER — Ambulatory Visit: Payer: Managed Care, Other (non HMO) | Admitting: Cardiovascular Disease

## 2015-01-15 ENCOUNTER — Ambulatory Visit (HOSPITAL_COMMUNITY)
Admission: RE | Admit: 2015-01-15 | Discharge: 2015-01-15 | Disposition: A | Discharge status: Home / Self Care | Payer: Managed Care, Other (non HMO) | Source: Ambulatory Visit | Attending: Cardiovascular Disease | Admitting: Cardiovascular Disease

## 2015-01-15 DIAGNOSIS — Z87891 Personal history of nicotine dependence: Secondary | ICD-10-CM | POA: Insufficient documentation

## 2015-01-15 DIAGNOSIS — E669 Obesity, unspecified: Secondary | ICD-10-CM | POA: Insufficient documentation

## 2015-01-15 DIAGNOSIS — Z8249 Family history of ischemic heart disease and other diseases of the circulatory system: Secondary | ICD-10-CM | POA: Insufficient documentation

## 2015-01-15 DIAGNOSIS — I493 Ventricular premature depolarization: Secondary | ICD-10-CM

## 2015-01-15 DIAGNOSIS — Z6830 Body mass index (BMI) 30.0-30.9, adult: Secondary | ICD-10-CM | POA: Insufficient documentation

## 2015-01-15 DIAGNOSIS — R002 Palpitations: Secondary | ICD-10-CM

## 2015-01-15 DIAGNOSIS — R072 Precordial pain: Secondary | ICD-10-CM

## 2015-01-15 LAB — MYOCARDIAL PERFUSION IMAGING
CHL CUP MPHR: 165 {beats}/min
CHL CUP NUCLEAR SDS: 0
CHL RATE OF PERCEIVED EXERTION: 16
CSEPEW: 10.4 METS
CSEPHR: 88 %
CSEPPHR: 146 {beats}/min
Exercise duration (min): 9 min
Rest HR: 100 {beats}/min
SRS: 1
SSS: 1
TID: 1.12

## 2015-01-15 MED ORDER — TECHNETIUM TC 99M SESTAMIBI GENERIC - CARDIOLITE
31.0000 | Freq: Once | INTRAVENOUS | Status: AC | PRN
  Administered 2015-01-15: 31 via INTRAVENOUS

## 2015-01-15 MED ORDER — TECHNETIUM TC 99M SESTAMIBI GENERIC - CARDIOLITE
10.9000 | Freq: Once | INTRAVENOUS | Status: AC | PRN
  Administered 2015-01-15: 10.9 via INTRAVENOUS

## 2015-01-15 MED ORDER — TECHNETIUM TC 99M SESTAMIBI GENERIC - CARDIOLITE
1125.0000 | Freq: Once | INTRAVENOUS | Status: DC | PRN
Start: 2015-01-15 — End: 2015-01-15

## 2015-01-16 ENCOUNTER — Telehealth: Payer: Self-pay | Admitting: *Deleted

## 2015-01-16 NOTE — Telephone Encounter (Signed)
-----   Message from Skeet Latch, MD sent at 01/16/2015  8:11 AM EST ----- normal stress test

## 2015-01-16 NOTE — Telephone Encounter (Signed)
Spoke to patient. Result given . Verbalized understanding  

## 2015-01-31 ENCOUNTER — Ambulatory Visit: Payer: Managed Care, Other (non HMO) | Admitting: Cardiovascular Disease

## 2015-02-14 NOTE — Progress Notes (Signed)
Cardiology Office Note   Date:  02/15/2015   ID:  Raven Weaver, DOB 1960/03/16, MRN [id number]  PCP:  Raven Kern., DO  Cardiologist:   Raven Harness, MD   Chief Complaint  Patient presents with  . Follow-up    NO COMPLAINTS.     Patient ID: Raven Weaver is a 54 y.o. female with diabetes who presents for follow up on PVCs.   Interval History 02/15/15: At her last appointment she was referred for exercise Myoview hat was negative for ischemia. She was noted to have ectopy during the exam. She was also referred for 24-hour Holter but has not yet completed this test.  She was also started on a PPI to determine if her symptoms may also be due to gastroesophageal reflux disease.she states that Nexium did not help her symptoms.  She stopped taking it and switch to over-the-counter Prilosec. Since then, her symptoms have been much better controlled. She has heartburn much less frequently.  [name] denies any chest pain, shortness of breath, lower extremity edema, orthopnea or PND.  She continues to have occasional palpitations. They last for a few minutes at a time. She does not get lightheaded or dizzy when it occurs. There is no associated shortness of breath.   History of Present Illness 12/26/14: [name] presented for colonoscopy on 10/26 where she was noted to have an irregular heart beat.  EKG showed ventricular trigeminy with frequent PVCs.  Her procedure was cancelled and she was referred to cardiology.  She saw her PCP, Dr. Alysia Weaver, later that day.  At that time her thyroid function was normal, as was her renal function and electrolytes.  However, her WBC was 16.3 and hemoglobin A1c was 7%.  Prior to this she never knew that she had an irregular heart beat.  She has not noted any palpitations or chest pain.  Since that time she has noted intermittent palpitations. Initially it was sporadic but has has started to be more constant.  The episodes last up  to 30 seconds and they are not associated with shortness of breath.  There is no associated lightheadedness or dizziness.   [name] has a history of indigestion but has noted increased heart burn over the last couple of days. She has tried taking Tums which helps.  Episodes occur up to multiple times in the day or may not happen for weeks at a time.  She has also noted tingling in the L 4th and 5h digits.    [name] walks once or twice per week.  She deneis chest pain, or SOB, diaphoresis with this activity. She also denies lightheadedness.  She reports that her diet is pretty good. She eats good portions and drinks mostly water. She drinks wine 1-2 times per week. She quit smoking last week.   Past Medical History  Diagnosis Date  . GERD (gastroesophageal reflux disease)   . Kidney stones     remote, > 30 years ago  . Tobacco use   . Abnormal Pap smear of cervix     remote, > 25 years ago, s/p cone bxs, all normal since per her report  . Hyperglycemia     gestational diabetes  . SVT (supraventricular tachycardia) ([place]) 12/26/2014  . PVC (premature ventricular contraction) 12/26/2014    Past Surgical History  Procedure Laterality Date  . Cesarean section    . Ablation      uterine     Current Outpatient  Prescriptions  Medication Sig Dispense Refill  . aspirin EC 81 MG tablet Take 1 tablet (81 mg total) by mouth daily. 90 tablet 3  . nitroGLYCERIN (NITROSTAT) 0.4 MG SL tablet Place 1 tablet (0.4 mg total) under the tongue every 5 (five) minutes as needed for chest pain. 25 tablet 6   No current facility-administered medications for this visit.    Allergies:   Review of patient's allergies indicates no known allergies.    Social History:  The patient  reports that she quit smoking about 8 weeks ago. Her smoking use included Cigarettes. She smoked 0.25 packs per day. She has never used smokeless tobacco. She reports that she drinks alcohol. She reports that she does not  use illicit drugs.   Family History:  The patient's family history includes CAD in her mother; Cancer in her maternal aunt and paternal grandmother; Diabetes in her maternal grandfather and mother; Heart disease (age of onset: 1) in her mother; Hypertension in her maternal grandfather and mother. There is no history of Colon cancer.    ROS:  Please see the history of present illness.   Otherwise, review of systems are positive for none.   All other systems are reviewed and negative.    PHYSICAL EXAM: VS:  BP 132/80 mmHg  Pulse 96  Ht 5\' 4"  (1.626 m)  Wt 81.194 kg (179 lb)  BMI 30.71 kg/m2  LMP 02/16/2006 , BMI Body mass index is 30.71 kg/(m^2). GENERAL:  Well appearing HEENT:  Pupils equal round and reactive, fundi not visualized, oral mucosa unremarkable NECK:  No jugular venous distention, waveform within normal limits, carotid upstroke brisk and symmetric, no bruits, no thyromegaly LYMPHATICS:  No cervical adenopathy LUNGS:  Clear to auscultation bilaterally HEART: Mostly regular with frequent ectopy.  PMI not displaced or sustained,S1 and S2 within normal limits, no S3, no S4, no clicks, no rubs, no murmurs ABD:  Flat, positive bowel sounds normal in frequency in pitch, no bruits, no rebound, no guarding, no midline pulsatile mass, no hepatomegaly, no splenomegaly EXT:  2 plus pulses throughout, no edema, no cyanosis no clubbing SKIN:  No rashes no nodules NEURO:  Cranial nerves II through XII grossly intact, motor grossly intact throughout PSYCH:  Cognitively intact, oriented to person place and time    EKG:  EKG is not ordered today.  Exercise Myoview 01/15/15:     There was no ST segment deviation noted during stress.  This is a low risk study.  Low risk stress nuclear study with a small, mild, fixed apical defect likely related to apical thinning; no ischemia; study not gated due to ventricular ectopy.  Recent Labs: 12/12/2014: BUN 10; Creatinine, Ser 0.88;  Hemoglobin 15.3*; Magnesium 2.3; Platelets 322.0; Potassium 4.3; Sodium 143; TSH 0.58    Lipid Panel No results found for: CHOL, TRIG, HDL, CHOLHDL, VLDL, LDLCALC, LDLDIRECT    Wt Readings from Last 3 Encounters:  02/15/15 81.194 kg (179 lb)  01/15/15 80.287 kg (177 lb)  01/08/15 80.151 kg (176 lb 11.2 oz)      ASSESSMENT AND PLAN:  # PVCs: Ms. Holsten PVCs are not particularly troubling to her. She does not have any evidence of heart failure on exam. Her electrolytes are within normal limits. Stress testing was negative for ischemia.  We will obtain an echocardiogram to evaluate for structural heart disease. She will also have a 24-hour Holter monitor placed today to assess her PVC burden. Based on these findings, we will consider starting a beta blocker.  #  GERD: Improved with PPI.  # Tobacco abuse: Ms. Molock was congratulated continued smoking cessation..  # Elevated blood pressure:  BP well-controlled today.  Current medicines are reviewed at length with the patient today.  The patient does not have concerns regarding medicines.  The following changes have been made:  no change  Labs/ tests ordered today include:   Orders Placed This Encounter  Procedures  . ECHOCARDIOGRAM COMPLETE    Disposition:   FU with Sarahanne Novakowski C. [name], MD in 6 months.   Signed, Raven Harness, MD  02/15/2015 11:23 AM    [facility]

## 2015-02-15 ENCOUNTER — Ambulatory Visit (INDEPENDENT_AMBULATORY_CARE_PROVIDER_SITE_OTHER): Payer: Managed Care, Other (non HMO) | Admitting: Cardiovascular Disease

## 2015-02-15 ENCOUNTER — Encounter: Payer: Self-pay | Admitting: Cardiovascular Disease

## 2015-02-15 VITALS — BP 132/80 | HR 96 | Ht 64.0 in | Wt 179.0 lb

## 2015-02-15 DIAGNOSIS — R030 Elevated blood-pressure reading, without diagnosis of hypertension: Secondary | ICD-10-CM

## 2015-02-15 DIAGNOSIS — IMO0001 Reserved for inherently not codable concepts without codable children: Secondary | ICD-10-CM

## 2015-02-15 DIAGNOSIS — K219 Gastro-esophageal reflux disease without esophagitis: Secondary | ICD-10-CM

## 2015-02-15 DIAGNOSIS — I493 Ventricular premature depolarization: Secondary | ICD-10-CM

## 2015-02-15 NOTE — Patient Instructions (Addendum)
Dr [name] has made no changes today in your current medications or treatment plan.  Your physician has requested that you have an echocardiogram. Echocardiography is a painless test that uses sound waves to create images of your heart. It provides your doctor with information about the size and shape of your heart and how well your heart's chambers and valves are working. This procedure takes approximately one hour. There are no restrictions for this procedure.  Your physician has recommended that you wear a holter monitor. Holter monitors are medical devices that record the heart's electrical activity. Doctors most often use these monitors to diagnose arrhythmias. Arrhythmias are problems with the speed or rhythm of the heartbeat. The monitor is a small, portable device. You can wear one while you do your normal daily activities. This is usually used to diagnose what is causing palpitations/syncope (passing out).  Your physician recommends that you schedule a follow-up appointment in 6 months. You will receive a reminder letter in the mail two months in advance. If you don't receive a letter, please call our office to schedule the follow-up appointment.  If you need a refill on your cardiac medications before your next appointment, please call your pharmacy.

## 2015-02-19 ENCOUNTER — Other Ambulatory Visit: Payer: Self-pay

## 2015-02-19 ENCOUNTER — Ambulatory Visit (HOSPITAL_COMMUNITY): Payer: Managed Care, Other (non HMO) | Attending: Cardiovascular Disease

## 2015-02-19 DIAGNOSIS — I517 Cardiomegaly: Secondary | ICD-10-CM | POA: Insufficient documentation

## 2015-02-19 DIAGNOSIS — I493 Ventricular premature depolarization: Secondary | ICD-10-CM | POA: Insufficient documentation

## 2015-02-19 DIAGNOSIS — E119 Type 2 diabetes mellitus without complications: Secondary | ICD-10-CM | POA: Insufficient documentation

## 2015-02-19 DIAGNOSIS — Z87891 Personal history of nicotine dependence: Secondary | ICD-10-CM | POA: Diagnosis not present

## 2015-02-19 DIAGNOSIS — I351 Nonrheumatic aortic (valve) insufficiency: Secondary | ICD-10-CM | POA: Diagnosis not present

## 2015-02-22 ENCOUNTER — Ambulatory Visit (INDEPENDENT_AMBULATORY_CARE_PROVIDER_SITE_OTHER): Payer: Managed Care, Other (non HMO)

## 2015-02-22 DIAGNOSIS — R072 Precordial pain: Secondary | ICD-10-CM | POA: Diagnosis not present

## 2015-02-22 DIAGNOSIS — I493 Ventricular premature depolarization: Secondary | ICD-10-CM

## 2015-02-22 DIAGNOSIS — R002 Palpitations: Secondary | ICD-10-CM

## 2015-02-25 ENCOUNTER — Telehealth: Payer: Self-pay | Admitting: *Deleted

## 2015-02-25 NOTE — Telephone Encounter (Signed)
-----   Message from Skeet Latch, MD sent at 02/24/2015  4:26 PM EST ----- Mild leaking of the aortic valve.  Otherwise, normal echo.

## 2015-02-25 NOTE — Telephone Encounter (Signed)
Spoke to patient.  Echo Result given . Verbalized understanding  

## 2015-02-25 NOTE — Telephone Encounter (Signed)
Spoke to patient  She will contact office back

## 2015-02-27 ENCOUNTER — Telehealth: Payer: Self-pay | Admitting: *Deleted

## 2015-02-27 MED ORDER — METOPROLOL TARTRATE 25 MG PO TABS: 12.5000 mg | ORAL_TABLET | Freq: Two times a day (BID) | ORAL | Status: DC

## 2015-02-27 NOTE — Telephone Encounter (Signed)
LEFT MESSAGE TO CALL BACK

## 2015-02-27 NOTE — Telephone Encounter (Signed)
Spoke to patient. Result given . Verbalized understanding Appointment schedule for 04/08/15. E-sent metropolol to pharmacy

## 2015-02-27 NOTE — Telephone Encounter (Signed)
-----   Message from Skeet Latch, MD sent at 02/26/2015  2:21 PM EST ----- Monitor showed very frequent PVCs.18% of her heart beats are PVCs. We should stat a medication to reduce the number of PVCs.  If this continues, it could cause heart failure.  We'll start metoprolol tartrate 12.5 mg twice daily. Follow-up in one month.

## 2015-03-21 LAB — HM DIABETES EYE EXAM

## 2015-04-06 NOTE — Progress Notes (Signed)
Cardiology Office Note   Date:  04/08/2015   ID:  Raven Weaver, DOB Feb 10, 1961, MRN [id number]  PCP:  [name]., DO  Cardiologist:   Sharol Harness, MD   Chief Complaint  Patient presents with  . Follow-up    no chest pain, no shortness of breath, no swelling, no cramping, no dizziness or lightheadedness     Patient ID: Raven Weaver is a 55 y.o. female with diabetes who presents for follow up on PVCs.   Interval History 04/08/15: After her last appointment Raven Weaver had an echo that showed LVEF 55-60% and mild AR.  She also had a 24 hour Holter that showed 18% monomorphic PVCs.  She was started on metoprolol 12.5 mg bid.  Since her appointment she has been feeling well.  She denies chest pain, shortness of breath, lightheadedness, dizziness, palpitations. She has not been exercising lately and admits to gaining weight. She plans to start walking now that the weather is better. She is also started back smoking one to 2 cigarettes daily. She would like to use the nicotine patch to reduce her cravings.   Interval History 02/15/15: At her last appointment she was referred for exercise Myoview hat was negative for ischemia. She was noted to have ectopy during the exam. She was also referred for 24-hour Holter but has not yet completed this test.  She was also started on a PPI to determine if her symptoms may also be due to gastroesophageal reflux disease.she states that Nexium did not help her symptoms.  She stopped taking it and switch to over-the-counter Prilosec. Since then, her symptoms have been much better controlled. She has heartburn much less frequently.  Raven Weaver denies any chest pain, shortness of breath, lower extremity edema, orthopnea or PND.  She continues to have occasional palpitations. They last for a few minutes at a time. She does not get lightheaded or dizzy when it occurs. There is no associated shortness of breath.   History of Present  Illness 12/26/14: Raven Weaver presented for colonoscopy on 10/26 where she was noted to have an irregular heart beat.  EKG showed ventricular trigeminy with frequent PVCs.  Her procedure was cancelled and she was referred to cardiology.  She saw her PCP, Raven Weaver, later that day.  At that time her thyroid function was normal, as was her renal function and electrolytes.  However, her WBC was 16.3 and hemoglobin A1c was 7%.  Prior to this she never knew that she had an irregular heart beat.  She has not noted any palpitations or chest pain.  Since that time she has noted intermittent palpitations. Initially it was sporadic but has has started to be more constant.  The episodes last up to 30 seconds and they are not associated with shortness of breath.  There is no associated lightheadedness or dizziness.   Raven Weaver has a history of indigestion but has noted increased heart burn over the last couple of days. She has tried taking Tums which helps.  Episodes occur up to multiple times in the day or may not happen for weeks at a time.  She has also noted tingling in the L 4th and 5h digits.    Raven Weaver walks once or twice per week.  She deneis chest pain, or SOB, diaphoresis with this activity. She also denies lightheadedness.  She reports that her diet is pretty good. She eats good portions and drinks mostly water. She drinks wine  1-2 times per week. She quit smoking last week.   Past Medical History  Diagnosis Date  . GERD (gastroesophageal reflux disease)   . Kidney stones     remote, > 30 years ago  . Tobacco use   . Abnormal Pap smear of cervix     remote, > 25 years ago, s/p cone bxs, all normal since per her report  . Hyperglycemia     gestational diabetes  . SVT (supraventricular tachycardia) ([place]) 12/26/2014  . PVC (premature ventricular contraction) 12/26/2014  . Essential hypertension 04/08/2015    Past Surgical History  Procedure Laterality Date  . Cesarean section    .  Ablation      uterine     Current Outpatient Prescriptions  Medication Sig Dispense Refill  . nitroGLYCERIN (NITROSTAT) 0.4 MG SL tablet Place 1 tablet (0.4 mg total) under the tongue every 5 (five) minutes as needed for chest pain. 25 tablet 6  . metoprolol tartrate (LOPRESSOR) 25 MG tablet Take 1 tablet (25 mg total) by mouth 2 (two) times daily. 60 tablet 11   No current facility-administered medications for this visit.    Allergies:   Review of patient's allergies indicates no known allergies.    Social History:  The patient  reports that she has been smoking Cigarettes.  She has been smoking about 0.25 packs per day. She has never used smokeless tobacco. She reports that she drinks alcohol. She reports that she does not use illicit drugs.   Family History:  The patient's family history includes CAD in her mother; Cancer in her maternal aunt and paternal grandmother; Diabetes in her maternal grandfather and mother; Heart disease (age of onset: 50) in her mother; Hypertension in her maternal grandfather and mother. There is no history of Colon cancer.    ROS:  Please see the history of present illness.   Otherwise, review of systems are positive for none.   All other systems are reviewed and negative.    PHYSICAL EXAM: VS:  BP 154/74 mmHg  Pulse 64  Ht 5\' 4"  (1.626 m)  Wt 81.194 kg (179 lb)  BMI 30.71 kg/m2  LMP 02/16/2006 , BMI Body mass index is 30.71 kg/(m^2). GENERAL:  Well appearing HEENT:  Pupils equal round and reactive, fundi not visualized, oral mucosa unremarkable NECK:  No jugular venous distention, waveform within normal limits, carotid upstroke brisk and symmetric, no bruits LYMPHATICS:  No cervical adenopathy LUNGS:  Clear to auscultation bilaterally HEART: Mostly regular with frequent ectopy.  PMI not displaced or sustained,S1 and S2 within normal limits, no S3, no S4, no clicks, no rubs, no murmurs ABD:  Flat, positive bowel sounds normal in frequency in pitch,  no bruits, no rebound, no guarding, no midline pulsatile mass, no hepatomegaly, no splenomegaly EXT:  2 plus pulses throughout, no edema, no cyanosis no clubbing SKIN:  No rashes no nodules NEURO:  Cranial nerves II through XII grossly intact, motor grossly intact throughout PSYCH:  Cognitively intact, oriented to person place and time    EKG:  EKG is not ordered today.  Exercise Myoview 01/15/15:     There was no ST segment deviation noted during stress.  This is a low risk study.  Low risk stress nuclear study with a small, mild, fixed apical defect likely related to apical thinning; no ischemia; study not gated due to ventricular ectopy.  Echo 02/19/15: Study Conclusions  - Left ventricle: The cavity size was normal. There was moderate concentric hypertrophy. Systolic function was  normal. The estimated ejection fraction was in the range of 55% to 60%. Wall motion was normal; there were no regional wall motion abnormalities. - Aortic valve: There was mild regurgitation. - Left atrium: The atrium was mildly dilated. - Atrial septum: No defect or patent foramen ovale was identified.  24 Hour Holter Monitor 02/22/15:  Quality: Fair. Baseline artifact. Predominant rhythm: sinus rhythm Average heart rate: 96 bpm Max heart rate: 115 bpm Min heart rate: 81 bpm Pauses >2.5 seconds: 0 Ventricular ectopics: 25,201 (4159 isolated, 55 bigemeny, 220,868 trigemeny, 117 quadrigeminy events)  Percentage of ectopic beats: 18.4 Morphology: monomorphic Supraventricular ectopics: 3  Recent Labs: 12/12/2014: BUN 10; Creatinine, Ser 0.88; Hemoglobin 15.3*; Magnesium 2.3; Platelets 322.0; Potassium 4.3; Sodium 143; TSH 0.58    Lipid Panel No results found for: CHOL, TRIG, HDL, CHOLHDL, VLDL, LDLCALC, LDLDIRECT    Wt Readings from Last 3 Encounters:  04/08/15 81.194 kg (179 lb)  02/15/15 81.194 kg (179 lb)  01/15/15 80.287 kg (177 lb)      ASSESSMENT AND PLAN:  # PVCs:  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  She does not feel any palpitations. On my exam today she had one stretch of 10 beats that were normal sinus rhythm. However this was followed by frequent ectopy.  She has a structurally normal heart as evidenced on echo and no ischemia as seen on her stress test. However, I'm concerned that over time this may cause a tachycardia-induced cardiomyopathy.  We will increase her metoprolol to 25 mg twice a day.  We will repeat her Holter in 3 months to assess the frequency of PVCs. If it continues to be near 20% we will refer her to electrophysiology for consideration of ablation.  # Tobacco abuse: Raven Weaver has started back smoking a small amount. Encouraged her to pur  # Elevated blood pressure:  BP is slightly above goal today. We will increase metoprolol as above and she will call if her blood pressure is greater than 140/90 mmHg.  Current medicines are reviewed at length with the patient today.  The patient does not have concerns regarding medicines.  The following changes have been made:  Increase metoprolol to 25 mg twice a day. Labs/ tests ordered today include:   Orders Placed This Encounter  Procedures  . Holter monitor - 24 hour    Disposition:   FU with Janiel Crisostomo C. [name], MD in 3 months.    Signed, Sharol Harness, MD  04/08/2015 5:52 PM    [place]

## 2015-04-08 ENCOUNTER — Ambulatory Visit (INDEPENDENT_AMBULATORY_CARE_PROVIDER_SITE_OTHER): Payer: Managed Care, Other (non HMO) | Admitting: Cardiovascular Disease

## 2015-04-08 ENCOUNTER — Encounter: Payer: Self-pay | Admitting: Cardiovascular Disease

## 2015-04-08 VITALS — BP 154/74 | HR 64 | Ht 64.0 in | Wt 179.0 lb

## 2015-04-08 DIAGNOSIS — I493 Ventricular premature depolarization: Secondary | ICD-10-CM

## 2015-04-08 DIAGNOSIS — Z720 Tobacco use: Secondary | ICD-10-CM | POA: Diagnosis not present

## 2015-04-08 DIAGNOSIS — I10 Essential (primary) hypertension: Secondary | ICD-10-CM | POA: Diagnosis not present

## 2015-04-08 HISTORY — DX: Essential (primary) hypertension: I10

## 2015-04-08 MED ORDER — METOPROLOL TARTRATE 25 MG PO TABS: 25.0000 mg | ORAL_TABLET | Freq: Two times a day (BID) | ORAL | Status: DC

## 2015-04-08 NOTE — Patient Instructions (Addendum)
Please check your blood pressure once or twice daily for the next week.  Call us if it is >140/90.  STOP ASPIRIN  INCREASE METOPROLOL TARTRATE 25 MG (1)TABLET TWICE A DAY  IN 3 MONTHS  (Jul 08, 2015)- WEAR A  24 HOUR  HOLTER MONITOR, THEN FOLLOW WITH OFFICE APPOINTMENT   Your physician has recommended that you wear a holter monitor in 3 months (MAY 2017) WILL BE PLACED AT [place] [address]. Holter monitors are medical devices that record the heart's electrical activity. Doctors most often use these monitors to diagnose arrhythmias. Arrhythmias are problems with the speed or rhythm of the heartbeat. The monitor is a small, portable device. You can wear one while you do your normal daily activities. This is usually used to diagnose what is causing palpitations/syncope (passing out),pvc's.  Your physician wants you to follow-up in 3 months with DR [place]. -AFTER MONITOR IS COMPLETED.

## 2015-05-07 ENCOUNTER — Ambulatory Visit: Payer: Managed Care, Other (non HMO) | Admitting: Family Medicine

## 2015-07-16 ENCOUNTER — Ambulatory Visit: Payer: Managed Care, Other (non HMO) | Admitting: Cardiovascular Disease

## 2015-08-12 ENCOUNTER — Ambulatory Visit (INDEPENDENT_AMBULATORY_CARE_PROVIDER_SITE_OTHER): Payer: Managed Care, Other (non HMO) | Admitting: Family Medicine

## 2015-08-12 ENCOUNTER — Encounter: Payer: Self-pay | Admitting: Family Medicine

## 2015-08-12 VITALS — BP 150/80 | HR 79 | Temp 98.2°F | Ht 64.0 in | Wt 183.7 lb

## 2015-08-12 DIAGNOSIS — E119 Type 2 diabetes mellitus without complications: Secondary | ICD-10-CM | POA: Insufficient documentation

## 2015-08-12 DIAGNOSIS — I493 Ventricular premature depolarization: Secondary | ICD-10-CM

## 2015-08-12 DIAGNOSIS — I10 Essential (primary) hypertension: Secondary | ICD-10-CM

## 2015-08-12 DIAGNOSIS — Z6831 Body mass index (BMI) 31.0-31.9, adult: Secondary | ICD-10-CM | POA: Diagnosis not present

## 2015-08-12 LAB — POCT GLYCOSYLATED HEMOGLOBIN (HGB A1C): Hemoglobin A1C: 7.5

## 2015-08-12 MED ORDER — METFORMIN HCL 500 MG PO TABS: 500.0000 mg | ORAL_TABLET | Freq: Two times a day (BID) | ORAL | Status: DC

## 2015-08-12 MED ORDER — LISINOPRIL 10 MG PO TABS: 10.0000 mg | ORAL_TABLET | Freq: Every day | ORAL | Status: DC

## 2015-08-12 NOTE — Progress Notes (Signed)
Pre visit review using our clinic review tool, if applicable. No additional management support is needed unless otherwise documented below in the visit note. 

## 2015-08-12 NOTE — Progress Notes (Signed)
HPI:  Raven Weaver  is a pleasant 55 year old here for follow-up. She reports she felt lightheaded a few days ago and checked her sugar and it was 176. She eats a healthy diet for the most part per her report. Does put several spoons of sugar in her coffee. She typically eats 2 eggs for breakfast with coffee with several spoonfuls of sugar. For lunch she usually has leftovers which usually consist of some sort of lean meats and vegetables. She denies consumption of simple starches or sweets other than the sugar in her coffee. No regular exercise. She sees a cardiologist for her PVCs and hypertension. Denies chest pain, shortness of breath, polyuria, polydipsia, vision changes. She quit smoking 8 weeks ago!  ROS: See pertinent positives and negatives per HPI.  Past Medical History  Diagnosis Date  . GERD (gastroesophageal reflux disease)   . Kidney stones     remote, > 30 years ago  . Tobacco use   . Abnormal Pap smear of cervix     remote, > 25 years ago, s/p cone bxs, all normal since per her report  . Hyperglycemia     gestational diabetes  . SVT (supraventricular tachycardia) ([place]) 12/26/2014  . PVC (premature ventricular contraction) 12/26/2014  . Essential hypertension 04/08/2015    Past Surgical History  Procedure Laterality Date  . Cesarean section    . Ablation      uterine    Family History  Problem Relation Age of Onset  . Hypertension Mother   . Diabetes Mother   . Heart disease Mother 23    MI  . CAD Mother   . Cancer Maternal Aunt   . Diabetes Maternal Grandfather   . Hypertension Maternal Grandfather   . Cancer Paternal Grandmother   . Colon cancer Neg Hx     Social History   Social History  . Marital Status: Married    Spouse Name: N/A  . Number of Children: N/A  . Years of Education: N/A   Social History Main Topics  . Smoking status: Former Smoker -- 0.25 packs/day    Types: Cigarettes    Quit date: 12/19/2014  . Smokeless tobacco: Never  Used     Comment: per patient quit 8 weeks ago  . Alcohol Use: 0.0 oz/week    0 Standard drinks or equivalent per week     Comment: 1 glass every 2 weeks  . Drug Use: No  . Sexual Activity: Not Asked   Other Topics Concern  . None   Social History Narrative   Work or School: full time for [employer] Situation: lives with mom and kids home during the summer      Spiritual Beliefs: Christian      Lifestyle: walks 2-3 days per week; diet is fair per her report - avoid fried foods - tries to eat veggies and lean meat           Current outpatient prescriptions:  .  metoprolol tartrate (LOPRESSOR) 25 MG tablet, Take 1 tablet (25 mg total) by mouth 2 (two) times daily., Disp: 60 tablet, Rfl: 11 .  nitroGLYCERIN (NITROSTAT) 0.4 MG SL tablet, Place 1 tablet (0.4 mg total) under the tongue every 5 (five) minutes as needed for chest pain., Disp: 25 tablet, Rfl: 6 .  lisinopril (PRINIVIL,ZESTRIL) 10 MG tablet, Take 1 tablet (10 mg total) by mouth daily., Disp: 30 tablet, Rfl: 3 .  metFORMIN (GLUCOPHAGE) 500 MG tablet, Take 1 tablet (  500 mg total) by mouth 2 (two) times daily with a meal., Disp: 180 tablet, Rfl: 3  EXAM:  Filed Vitals:   08/12/15 1653  BP: 150/80  Pulse: 79  Temp: 98.2 F (36.8 C)    Body mass index is 31.52 kg/(m^2).  GENERAL: vitals reviewed and listed above, alert, oriented, appears well hydrated and in no acute distress  HEENT: atraumatic, conjunttiva clear, no obvious abnormalities on inspection of external nose and ears  NECK: no obvious masses on inspection  LUNGS: clear to auscultation bilaterally, no wheezes, rales or rhonchi, good air movement  CV: HRRR, no peripheral edema  MS: moves all extremities without noticeable abnormality  PSYCH: pleasant and cooperative, no obvious depression or anxiety  ASSESSMENT AND PLAN:  Discussed the following assessment and plan: More than 50% of over 40 minutes spent in total in caring for this patient  was spent face-to-face with the patient, counseling and/or coordinating care.   Type 2 diabetes mellitus without complication, without long-term current use of insulin (HCC) - Plan: POC HgB 123456, Basic metabolic panel, Microalbumin/Creatinine Ratio, Urine -POC hemoglobin A1c is 7.5 -Discussed treatment options/risks and decided to start metformin, along with lifestyle changes -Diabetic foot exam normal today -reviewed daily diet and cutting down on sugar in coffee may help, o/w increasing veggies and healthy foods and exercise will be improtant -Labs per orders  Essential hypertension - Plan: Basic metabolic panel, CBC with Differential/Platelets -Discussed treatment options  -Continue metoprolol and add lisinopril after discussion risks/benefits -Labs per orders   PVC (premature ventricular contraction) -Continue metoprolol   BMI 31.0-31.9,adult -Lifestyle recommendations, advised to be used gradually into aerobic activity with a goal to reach 30 minutes 5 days per week  Congratulated on smoking cessation! -Patient advised to return or notify a doctor immediately if symptoms worsen or persist or new concerns arise.  Will need CPE with labs and lipids in next few months once acute issues resolved.  Patient Instructions  BEFORE YOU LEAVE: -Labs -follow up: 1 month  Start lisinopril 10 mg once daily for the blood pressure and kidney protection.  Start metformin 500 mg once daily for 1 week, then 500 mg twice daily.  We recommend the following healthy lifestyle measures: - eat a healthy whole foods diet consisting of regular small meals composed of vegetables, fruits, beans, nuts, seeds, healthy meats such as white chicken and fish and whole grains.  - avoid sweets, white starchy foods, fried foods, fast food, processed foods, sodas, red meet and other fattening foods.  - get a least 150-300 minutes of aerobic exercise per week.   We have ordered labs or studies at this visit. It  can take up to 1-2 weeks for results and processing. IF results require follow up or explanation, we will call you with instructions. Clinically stable results will be released to your [facility]. If you have not heard from [place] or cannot find your results in [facility] in 2 weeks please contact our office at [phone].  If you are not yet signed up for [facility], please consider signing up.              Colin Benton R.

## 2015-08-12 NOTE — Patient Instructions (Addendum)
BEFORE YOU LEAVE: -Labs -follow up: 1 month  Start lisinopril 10 mg once daily for the blood pressure and kidney protection.  Start metformin 500 mg once daily for 1 week, then 500 mg twice daily.  We recommend the following healthy lifestyle measures: - eat a healthy whole foods diet consisting of regular small meals composed of vegetables, fruits, beans, nuts, seeds, healthy meats such as white chicken and fish and whole grains.  - avoid sweets, white starchy foods, fried foods, fast food, processed foods, sodas, red meet and other fattening foods.  - get a least 150-300 minutes of aerobic exercise per week.   We have ordered labs or studies at this visit. It can take up to 1-2 weeks for results and processing. IF results require follow up or explanation, we will call you with instructions. Clinically stable results will be released to your [facility]. If you have not heard from [place] or cannot find your results in [facility] in 2 weeks please contact our office at [phone].  If you are not yet signed up for [facility], please consider signing up.

## 2015-08-12 NOTE — Addendum Note (Signed)
Addended by: Gari Crown D on: 08/12/2015 05:38 PM   Modules accepted: Orders

## 2015-08-13 LAB — CBC WITH DIFFERENTIAL/PLATELET
BASOS ABS: 0 10*3/uL (ref 0.0–0.1)
Basophils Relative: 0.4 % (ref 0.0–3.0)
EOS ABS: 0.1 10*3/uL (ref 0.0–0.7)
EOS PCT: 1 % (ref 0.0–5.0)
HCT: 44.5 % (ref 36.0–46.0)
HEMOGLOBIN: 15 g/dL (ref 12.0–15.0)
Lymphocytes Relative: 49.2 % — ABNORMAL HIGH (ref 12.0–46.0)
Lymphs Abs: 6 10*3/uL — ABNORMAL HIGH (ref 0.7–4.0)
MCHC: 33.7 g/dL (ref 30.0–36.0)
MCV: 90.3 fl (ref 78.0–100.0)
MONO ABS: 0.9 10*3/uL (ref 0.1–1.0)
Monocytes Relative: 7.4 % (ref 3.0–12.0)
Neutro Abs: 5.1 10*3/uL (ref 1.4–7.7)
Neutrophils Relative %: 42 % — ABNORMAL LOW (ref 43.0–77.0)
Platelets: 317 10*3/uL (ref 150.0–400.0)
RBC: 4.92 Mil/uL (ref 3.87–5.11)
RDW: 14.1 % (ref 11.5–15.5)
WBC: 12.2 10*3/uL — AB (ref 4.0–10.5)

## 2015-08-13 LAB — BASIC METABOLIC PANEL
BUN: 16 mg/dL (ref 6–23)
CALCIUM: 10 mg/dL (ref 8.4–10.5)
CO2: 26 mEq/L (ref 19–32)
CREATININE: 0.95 mg/dL (ref 0.40–1.20)
Chloride: 104 mEq/L (ref 96–112)
GFR: 78.13 mL/min (ref >60.00)
Glucose, Bld: 122 mg/dL — ABNORMAL HIGH (ref 70–99)
POTASSIUM: 4.4 meq/L (ref 3.5–5.1)
Sodium: 138 mEq/L (ref 135–145)

## 2015-09-12 ENCOUNTER — Ambulatory Visit (INDEPENDENT_AMBULATORY_CARE_PROVIDER_SITE_OTHER): Payer: Managed Care, Other (non HMO) | Admitting: Family Medicine

## 2015-09-12 ENCOUNTER — Encounter: Payer: Self-pay | Admitting: Family Medicine

## 2015-09-12 DIAGNOSIS — I10 Essential (primary) hypertension: Secondary | ICD-10-CM

## 2015-09-12 DIAGNOSIS — Z6831 Body mass index (BMI) 31.0-31.9, adult: Secondary | ICD-10-CM | POA: Diagnosis not present

## 2015-09-12 DIAGNOSIS — E119 Type 2 diabetes mellitus without complications: Secondary | ICD-10-CM | POA: Diagnosis not present

## 2015-09-12 NOTE — Progress Notes (Signed)
HPI:  Follow up:  DM: -meds: metformin started last visit - titrated to 500mg  bid -lifestyle recs last visit -reports: feel swell, no complaints, trying to watch diet and plans to start exercising -denies: hypoglycemia, diarrhea, vision changes -had eye exam, no retinopathy -lipids done with prior doc in 1-/2016, ldl around 100  HTN: -added lisinopril last visit; on metoprolol with cardiologist for PVCs -reports: doing well -denies: CP, SOB, DOE, HA -hx tobacco use - quit in 2017  Obesity: -BMI 31 last visit -lifestyle recs last visit -reports: plans to start exercising and working more on diet  ROS: See pertinent positives and negatives per HPI.  Past Medical History:  Diagnosis Date  . Abnormal Pap smear of cervix    remote, > 25 years ago, s/p cone bxs, all normal since per her report  . Essential hypertension 04/08/2015  . GERD (gastroesophageal reflux disease)   . Hyperglycemia    gestational diabetes  . Kidney stones    remote, > 30 years ago  . PVC (premature ventricular contraction) 12/26/2014  . SVT (supraventricular tachycardia) ([place]) 12/26/2014  . Tobacco use     Past Surgical History:  Procedure Laterality Date  . ABLATION     uterine  . CESAREAN SECTION      Family History  Problem Relation Age of Onset  . Hypertension Mother   . Diabetes Mother   . Heart disease Mother 69    MI  . CAD Mother   . Cancer Maternal Aunt   . Diabetes Maternal Grandfather   . Hypertension Maternal Grandfather   . Cancer Paternal Grandmother   . Colon cancer Neg Hx     Social History   Social History  . Marital status: Married    Spouse name: N/A  . Number of children: N/A  . Years of education: N/A   Social History Main Topics  . Smoking status: Former Smoker    Packs/day: 0.25    Types: Cigarettes    Quit date: 12/19/2014  . Smokeless tobacco: Never Used     Comment: per patient quit 8 weeks ago  . Alcohol use 0.0 oz/week     Comment: 1 glass every 2  weeks  . Drug use: No  . Sexual activity: Not Asked   Other Topics Concern  . None   Social History Narrative   Work or School: full time for [employer] Situation: lives with mom and kids home during the summer      Spiritual Beliefs: Christian      Lifestyle: walks 2-3 days per week; diet is fair per her report - avoid fried foods - tries to eat veggies and lean meat           Current Outpatient Prescriptions:  .  lisinopril (PRINIVIL,ZESTRIL) 10 MG tablet, Take 1 tablet (10 mg total) by mouth daily., Disp: 30 tablet, Rfl: 3 .  metFORMIN (GLUCOPHAGE) 500 MG tablet, Take 1 tablet (500 mg total) by mouth 2 (two) times daily with a meal., Disp: 180 tablet, Rfl: 3 .  metoprolol tartrate (LOPRESSOR) 25 MG tablet, Take 1 tablet (25 mg total) by mouth 2 (two) times daily., Disp: 60 tablet, Rfl: 11 .  nitroGLYCERIN (NITROSTAT) 0.4 MG SL tablet, Place 1 tablet (0.4 mg total) under the tongue every 5 (five) minutes as needed for chest pain., Disp: 25 tablet, Rfl: 6  EXAM:  Vitals:   09/12/15 1633  BP: 122/82  Pulse: 83  Temp: 99.3 F (37.4  C)    Body mass index is 31.79 kg/m.  GENERAL: vitals reviewed and listed above, alert, oriented, appears well hydrated and in no acute distress  HEENT: atraumatic, conjunttiva clear, no obvious abnormalities on inspection of external nose and ears  NECK: no obvious masses on inspection  LUNGS: clear to auscultation bilaterally, no wheezes, rales or rhonchi, good air movement  CV: HRRR, no peripheral edema  MS: moves all extremities without noticeable abnormality  PSYCH: pleasant and cooperative, no obvious depression or anxiety  ASSESSMENT AND PLAN:  Discussed the following assessment and plan:  Type 2 diabetes mellitus without complication, without long-term current use of insulin (HCC)  Tobacco use disorder  Essential hypertension  -continue current medications -lifestyle recs discussed at length -needs lipid panel  and bmp - will do BMP today given new meds; plan CPE with fasting labs in 3 months -Patient advised to return or notify a doctor immediately if symptoms worsen or persist or new concerns arise.  There are no Patient Instructions on file for this visit.  Colin Benton R., DO

## 2015-09-12 NOTE — Progress Notes (Signed)
Pre visit review using our clinic review tool, if applicable. No additional management support is needed unless otherwise documented below in the visit note. 

## 2015-09-12 NOTE — Patient Instructions (Signed)
BEFORE YOU LEAVE: -follow up: schedule physical in 3 month; come fasting if possible to check labs at that visit - if in afternoon do not fast unless convenient for you  -lab  Continue the metformin and the lisinopril  We recommend the following healthy lifestyle: 1) Small portions - eat off of salad plate instead of dinner plate 2) Eat a healthy clean diet with avoidance of (less then 1 serving per week) processed foods, sweetened drinks, white starches, red meat, fast foods and sweets and consisting of: * 5-9 servings per day of fresh or frozen fruits and vegetables (not corn or potatoes, not dried or canned) *nuts and seeds, beans *olives and olive oil *small portions of lean meats such as fish and white chicken  *small portions of whole grains 3)Get at least 150 minutes of sweaty aerobic exercise per week 4)reduce stress - counseling, meditation, relaxation to balance other aspects of your life  We have ordered labs or studies at this visit. It can take up to 1-2 weeks for results and processing. IF results require follow up or explanation, we will call you with instructions. Clinically stable results will be released to your [facility]. If you have not heard from [place] or cannot find your results in [facility] in 2 weeks please contact our office at [phone].  If you are not yet signed up for [facility], please consider signing up.

## 2015-09-13 LAB — BASIC METABOLIC PANEL
BUN: 14 mg/dL (ref 6–23)
CHLORIDE: 105 meq/L (ref 96–112)
CO2: 26 meq/L (ref 19–32)
CREATININE: 0.97 mg/dL (ref 0.40–1.20)
Calcium: 9.7 mg/dL (ref 8.4–10.5)
GFR: 76.25 mL/min (ref >60.00)
Glucose, Bld: 160 mg/dL — ABNORMAL HIGH (ref 70–99)
POTASSIUM: 4.1 meq/L (ref 3.5–5.1)
Sodium: 138 mEq/L (ref 135–145)

## 2015-09-19 ENCOUNTER — Encounter: Payer: Self-pay | Admitting: *Deleted

## 2015-10-09 ENCOUNTER — Other Ambulatory Visit: Payer: Self-pay | Admitting: *Deleted

## 2015-10-09 MED ORDER — LISINOPRIL 10 MG PO TABS: 10.0000 mg | ORAL_TABLET | Freq: Every day | ORAL | 0 refills | Status: DC

## 2015-10-28 ENCOUNTER — Telehealth: Payer: Self-pay | Admitting: *Deleted

## 2015-10-28 NOTE — Telephone Encounter (Signed)
-----   Message from [name], DO sent at 09/16/2015  8:06 AM EDT ----- Please send letter if unable to reach. Thanks. ----- Message ----- From: SYSTEM Sent: 09/16/2015  12:05 AM To: [name], DO

## 2015-10-28 NOTE — Telephone Encounter (Signed)
Letter sent on 09/19/2015.

## 2015-12-13 ENCOUNTER — Ambulatory Visit: Payer: Managed Care, Other (non HMO) | Admitting: Family Medicine

## 2015-12-15 NOTE — Progress Notes (Signed)
HPI:  Follow up:  DM: -meds: metformin 500mg  bid -lifestyle recs last visit -reports: trying to eat health, very little sugar other then in her coffee in the morning -denies: hypoglycemia, diarrhea, vision changes -had eye exam, no retinopathy -lipids done with prior doc in 02/2014, ldl around 100 - NOT fasting  HTN: -meds: lisinopril 10, on metoprolol 25 with cardiologist for PVCs -reports: doing well -denies: CP, SOB, DOE, HA -hx tobacco use - quit in 2017  Obesity: -176 (12/2014) --> 185 (08/2015) --> 185 today -lifestyle recs last visit - in the last few months has really been trying to eat healthy, no regular exercise, is frustrated not loosing weight  Chronic cough: -x 4-5 months? -hx smoking, quit 11/2014 -tickle in throat, then coughs -no fevers, weight loss, malaise, wheezing, SOB -has hx possible allergies and gerd  ROS: See pertinent positives and negatives per HPI.  Past Medical History:  Diagnosis Date  . Abnormal Pap smear of cervix    remote, > 25 years ago, s/p cone bxs, all normal since per her report  . Essential hypertension 04/08/2015  . GERD (gastroesophageal reflux disease)   . Hyperglycemia    gestational diabetes  . Kidney stones    remote, > 30 years ago  . PVC (premature ventricular contraction) 12/26/2014  . SVT (supraventricular tachycardia) ([place]) 12/26/2014  . Tobacco use     Past Surgical History:  Procedure Laterality Date  . ABLATION     uterine  . CESAREAN SECTION      Family History  Problem Relation Age of Onset  . Hypertension Mother   . Diabetes Mother   . Heart disease Mother 83    MI  . CAD Mother   . Cancer Maternal Aunt   . Diabetes Maternal Grandfather   . Hypertension Maternal Grandfather   . Cancer Paternal Grandmother   . Colon cancer Neg Hx     Social History   Social History  . Marital status: Married    Spouse name: N/A  . Number of children: N/A  . Years of education: N/A   Social History Main  Topics  . Smoking status: Former Smoker    Packs/day: 0.25    Types: Cigarettes    Quit date: 12/19/2014  . Smokeless tobacco: Never Used     Comment: per patient quit 8 weeks ago  . Alcohol use 0.0 oz/week     Comment: 1 glass every 2 weeks  . Drug use: No  . Sexual activity: Not Asked   Other Topics Concern  . None   Social History Narrative   Work or School: full time for [employer] Situation: lives with mom and kids home during the summer      Spiritual Beliefs: Christian      Lifestyle: walks 2-3 days per week; diet is fair per her report - avoid fried foods - tries to eat veggies and lean meat           Current Outpatient Prescriptions:  .  lisinopril (PRINIVIL,ZESTRIL) 10 MG tablet, Take 1 tablet (10 mg total) by mouth daily., Disp: 90 tablet, Rfl: 0 .  metFORMIN (GLUCOPHAGE) 500 MG tablet, Take 1 tablet (500 mg total) by mouth 2 (two) times daily with a meal., Disp: 180 tablet, Rfl: 3 .  metoprolol tartrate (LOPRESSOR) 25 MG tablet, Take 1 tablet (25 mg total) by mouth 2 (two) times daily., Disp: 60 tablet, Rfl: 11 .  nitroGLYCERIN (NITROSTAT) 0.4 MG SL tablet,  Place 1 tablet (0.4 mg total) under the tongue every 5 (five) minutes as needed for chest pain., Disp: 25 tablet, Rfl: 6  EXAM:  Vitals:   12/16/15 1023  BP: 128/80  Pulse: 85  Temp: 98.4 F (36.9 C)    Body mass index is 31.89 kg/m.  GENERAL: vitals reviewed and listed above, alert, oriented, appears well hydrated and in no acute distress  HEENT: atraumatic, conjunttiva clear, no obvious abnormalities on inspection of external nose and ears, normal appearance of ear canals and TMs, clear nasal congestion, mild post oropharyngeal erythema with PND, no tonsillar edema or exudate, no sinus TTP  NECK: no obvious masses on inspection  LUNGS: clear to auscultation bilaterally, no wheezes, rales or rhonchi, good air movement  CV: HRRR, no peripheral edema  MS: moves all extremities without  noticeable abnormality  PSYCH: pleasant and cooperative, no obvious depression or anxiety  ASSESSMENT AND PLAN:  Discussed the following assessment and plan:  Type 2 diabetes mellitus without complication, without long-term current use of insulin (HCC) - Plan: Hemoglobin A1c  Essential hypertension - Plan: Basic metabolic panel, CBC (no diff)  Chronic cough - Plan: DG Chest 2 View  BMI 31.0-31.9,adult  Encounter for hepatitis C screening test for low risk patient - Plan: Hepatitis C antibody  -pneumonia and flu vaccines advised -advised due for colon ca screening - she agrees to call to schedule -labs, lipids at CPE -lifestyle recs; reviewed diet and lots of bad fats (eggs, sausage, dairy - opted to try to switch some of these out for healthier options) and start to exercise -for cough: cxr ordered today, trial INS, then PPI, then trial changing lisinopril - she is to call in 4- 6 weeks with update -CPE in 3 months -Patient advised to return or notify a doctor immediately if symptoms worsen or persist or new concerns arise.  Patient Instructions  BEFORE YOU LEAVE: -flu and pneumococcal vaccine -xray sheet -labs -follow up: CPE in 3-4 months  Call today to schedule your colonoscopy  For the cough: -get the chest xray, given smoking history -flonase 2 sprays each nostril daily for 1 month --> if that does not work prilosec 20mg  daily for 2 weeks --> if cough not gone please email (or call us) and we will consider further evaluation and stopping lisinopril.  We have ordered labs or studies at this visit. It can take up to 1-2 weeks for results and processing. IF results require follow up or explanation, we will call you with instructions. Clinically stable results will be released to your [facility]. If you have not heard from [place] or cannot find your results in [facility] in 2 weeks please contact our office at [phone].  If you are not yet signed up for [facility], please consider  signing up.   We recommend the following healthy lifestyle for LIFE: 1) Small portions.   Tip: eat off of a salad plate instead of a dinner plate.  Tip: It is ok to feel hungry after a meal - that likely means you ate an appropriate portion.  Tip: if you need more or a snack choose fruits, veggies and/or a handful of nuts or seeds.  2) Eat a healthy clean diet.  * Tip: Avoid (less then 1 serving per week): processed foods, sweets, sweetened drinks, white starches (rice, flour, bread, potatoes, pasta, etc), red meat, fast foods, butter  *Tip: CHOOSE instead   * 5-9 servings per day of fresh or frozen fruits and vegetables (but not  corn, potatoes, bananas, canned or dried fruit)   *nuts and seeds, beans   *olives and olive oil   *small portions of lean meats such as fish and white chicken    *small portions of whole grains  3)Get at least 150 minutes of sweaty aerobic exercise per week.  4)Reduce stress - consider counseling, meditation and relaxation to balance other aspects of your life.         Colin Benton R., DO

## 2015-12-16 ENCOUNTER — Encounter: Payer: Self-pay | Admitting: Family Medicine

## 2015-12-16 ENCOUNTER — Ambulatory Visit (INDEPENDENT_AMBULATORY_CARE_PROVIDER_SITE_OTHER): Payer: Managed Care, Other (non HMO) | Admitting: Family Medicine

## 2015-12-16 VITALS — BP 128/80 | HR 85 | Temp 98.4°F | Ht 64.0 in | Wt 185.8 lb

## 2015-12-16 DIAGNOSIS — R05 Cough: Secondary | ICD-10-CM | POA: Diagnosis not present

## 2015-12-16 DIAGNOSIS — R053 Chronic cough: Secondary | ICD-10-CM

## 2015-12-16 DIAGNOSIS — E119 Type 2 diabetes mellitus without complications: Secondary | ICD-10-CM | POA: Diagnosis not present

## 2015-12-16 DIAGNOSIS — Z6831 Body mass index (BMI) 31.0-31.9, adult: Secondary | ICD-10-CM

## 2015-12-16 DIAGNOSIS — Z23 Encounter for immunization: Secondary | ICD-10-CM | POA: Diagnosis not present

## 2015-12-16 DIAGNOSIS — Z1159 Encounter for screening for other viral diseases: Secondary | ICD-10-CM

## 2015-12-16 DIAGNOSIS — I10 Essential (primary) hypertension: Secondary | ICD-10-CM | POA: Diagnosis not present

## 2015-12-16 LAB — BASIC METABOLIC PANEL
BUN: 12 mg/dL (ref 6–23)
CALCIUM: 10 mg/dL (ref 8.4–10.5)
CO2: 28 mEq/L (ref 19–32)
CREATININE: 0.91 mg/dL (ref 0.40–1.20)
Chloride: 104 mEq/L (ref 96–112)
GFR: 82 mL/min (ref >60.00)
GLUCOSE: 176 mg/dL — AB (ref 70–99)
Potassium: 4.2 mEq/L (ref 3.5–5.1)
Sodium: 140 mEq/L (ref 135–145)

## 2015-12-16 LAB — CBC
HCT: 41.9 % (ref 36.0–46.0)
Hemoglobin: 13.9 g/dL (ref 12.0–15.0)
MCHC: 33.2 g/dL (ref 30.0–36.0)
MCV: 91 fl (ref 78.0–100.0)
Platelets: 319 10*3/uL (ref 150.0–400.0)
RBC: 4.61 Mil/uL (ref 3.87–5.11)
RDW: 13.9 % (ref 11.5–15.5)
WBC: 9.3 10*3/uL (ref 4.0–10.5)

## 2015-12-16 LAB — HEMOGLOBIN A1C: Hgb A1c MFr Bld: 6.9 % — ABNORMAL HIGH (ref 4.6–6.5)

## 2015-12-16 NOTE — Progress Notes (Signed)
Pre visit review using our clinic review tool, if applicable. No additional management support is needed unless otherwise documented below in the visit note. 

## 2015-12-16 NOTE — Patient Instructions (Signed)
BEFORE YOU LEAVE: -flu and pneumococcal vaccine -xray sheet -labs -follow up: CPE in 3-4 months  Call today to schedule your colonoscopy  For the cough: -get the chest xray, given smoking history -flonase 2 sprays each nostril daily for 1 month --> if that does not work prilosec 20mg  daily for 2 weeks --> if cough not gone please email (or call us) and we will consider further evaluation and stopping lisinopril.  We have ordered labs or studies at this visit. It can take up to 1-2 weeks for results and processing. IF results require follow up or explanation, we will call you with instructions. Clinically stable results will be released to your [facility]. If you have not heard from [place] or cannot find your results in [facility] in 2 weeks please contact our office at [phone].  If you are not yet signed up for [facility], please consider signing up.   We recommend the following healthy lifestyle for LIFE: 1) Small portions.   Tip: eat off of a salad plate instead of a dinner plate.  Tip: It is ok to feel hungry after a meal - that likely means you ate an appropriate portion.  Tip: if you need more or a snack choose fruits, veggies and/or a handful of nuts or seeds.  2) Eat a healthy clean diet.  * Tip: Avoid (less then 1 serving per week): processed foods, sweets, sweetened drinks, white starches (rice, flour, bread, potatoes, pasta, etc), red meat, fast foods, butter  *Tip: CHOOSE instead   * 5-9 servings per day of fresh or frozen fruits and vegetables (but not corn, potatoes, bananas, canned or dried fruit)   *nuts and seeds, beans   *olives and olive oil   *small portions of lean meats such as fish and white chicken    *small portions of whole grains  3)Get at least 150 minutes of sweaty aerobic exercise per week.  4)Reduce stress - consider counseling, meditation and relaxation to balance other aspects of your life.

## 2015-12-16 NOTE — Addendum Note (Signed)
Addended by: Agnes Lawrence on: 12/16/2015 11:18 AM   Modules accepted: Orders

## 2015-12-17 LAB — HEPATITIS C ANTIBODY: HCV Ab: NEGATIVE

## 2016-01-06 ENCOUNTER — Other Ambulatory Visit: Payer: Self-pay | Admitting: Family Medicine

## 2016-01-07 ENCOUNTER — Encounter: Payer: Self-pay | Admitting: *Deleted

## 2016-01-20 ENCOUNTER — Other Ambulatory Visit: Payer: Self-pay | Admitting: Family Medicine

## 2016-01-20 NOTE — Telephone Encounter (Signed)
Pt need new Rx for lisinopril   Pharm:  [facility] [place] [facility] [place]

## 2016-04-13 ENCOUNTER — Other Ambulatory Visit: Payer: Self-pay | Admitting: Cardiovascular Disease

## 2016-04-13 MED ORDER — METOPROLOL TARTRATE 25 MG PO TABS: 25.0000 mg | ORAL_TABLET | Freq: Two times a day (BID) | ORAL | 0 refills | Status: DC

## 2016-04-13 NOTE — Telephone Encounter (Signed)
Rx(s) sent to pharmacy electronically.  

## 2016-04-13 NOTE — Telephone Encounter (Signed)
New message       *STAT* If patient is at the pharmacy, call can be transferred to refill team.   1. Which medications need to be refilled? (please list name of each medication and dose if known) metoprolol 25mg   2. Which pharmacy/location (including street and city if local pharmacy) is [facility]  3. Do they need a 30 day or 90 day supply? [place]

## 2016-04-21 ENCOUNTER — Ambulatory Visit: Payer: Managed Care, Other (non HMO) | Admitting: Family Medicine

## 2016-04-21 ENCOUNTER — Encounter: Payer: Self-pay | Admitting: Family Medicine

## 2016-04-21 ENCOUNTER — Ambulatory Visit (INDEPENDENT_AMBULATORY_CARE_PROVIDER_SITE_OTHER): Payer: Managed Care, Other (non HMO) | Admitting: Family Medicine

## 2016-04-21 VITALS — BP 130/80 | HR 89 | Temp 98.3°F | Ht 64.0 in | Wt 197.2 lb

## 2016-04-21 DIAGNOSIS — R0789 Other chest pain: Secondary | ICD-10-CM | POA: Diagnosis not present

## 2016-04-21 DIAGNOSIS — Z566 Other physical and mental strain related to work: Secondary | ICD-10-CM

## 2016-04-21 NOTE — Patient Instructions (Addendum)
BEFORE YOU LEAVE: -follow up: 1 month for diabetes, hypertension, labs  Please schedule counseling to help with stress. Please call the number on the brochure today.  I also would recommend that you see a psychiatrist:  [facility]: Has a walk in clinic Address: [address], [place], [place] [address]  Hours:  Open ? Closes 5PM  [phone]   [facility]: Address: [address] [place], [place] [address]  Hours:  Open ? Closes 6:30PM  Phone: [phone]   [facility]: Phone [phone]  Seek emergency care if severe symptoms, thought of self harm or feeling out of control.

## 2016-04-21 NOTE — Progress Notes (Signed)
Pre visit review using our clinic review tool, if applicable. No additional management support is needed unless otherwise documented below in the visit note. 

## 2016-04-21 NOTE — Progress Notes (Addendum)
HPI:  Acute visit for anxiety and stress: -For several months, new position and she does not like her job -Reports constant stress related to her job -stressed that she has too many emails to respond to at work, and she feels pressure to get more done then she is able -Reports she took 3 days off from work last week because she just hates her job - when she went back the work load was even larger after being out for 3 days, and does not want to go back  -Denies: Depression, focus or cognitive issues, thoughts of self-harm, hallucinations, suicidal ideation, manic symptoms, concerns for safety  -She does not want to take any medications for this  Chest pain: -Occurred several weeks ago when she was doing laundry -She felt substernal chest pain felt like bad acid reflux About 1-2 hours and resolved -She thinks this was may be related to the stress and thinks perhaps was a panic attack -Denies further episodes of chest pain, shortness of breath, dyspnea on exertion, exertional chest pain  ROS: See pertinent positives and negatives per HPI.  Past Medical History:  Diagnosis Date  . Abnormal Pap smear of cervix    remote, > 25 years ago, s/p cone bxs, all normal since per her report  . Essential hypertension 04/08/2015  . GERD (gastroesophageal reflux disease)   . Hyperglycemia    gestational diabetes  . Kidney stones    remote, > 30 years ago  . PVC (premature ventricular contraction) 12/26/2014  . SVT (supraventricular tachycardia) ([place]) 12/26/2014  . Tobacco use     Past Surgical History:  Procedure Laterality Date  . ABLATION     uterine  . CESAREAN SECTION      Family History  Problem Relation Age of Onset  . Hypertension Mother   . Diabetes Mother   . Heart disease Mother 57    MI  . CAD Mother   . Cancer Maternal Aunt   . Diabetes Maternal Grandfather   . Hypertension Maternal Grandfather   . Cancer Paternal Grandmother   . Colon cancer Neg Hx     Social  History   Social History  . Marital status: Married    Spouse name: N/A  . Number of children: N/A  . Years of education: N/A   Social History Main Topics  . Smoking status: Former Smoker    Packs/day: 0.25    Types: Cigarettes    Quit date: 12/19/2014  . Smokeless tobacco: Never Used     Comment: per patient quit 8 weeks ago  . Alcohol use 0.0 oz/week     Comment: 1 glass every 2 weeks  . Drug use: No  . Sexual activity: Not Asked   Other Topics Concern  . None   Social History Narrative   Work or School: full time for [employer] Situation: lives with mom and kids home during the summer      Spiritual Beliefs: Christian      Lifestyle: walks 2-3 days per week; diet is fair per her report - avoid fried foods - tries to eat veggies and lean meat           Current Outpatient Prescriptions:  .  lisinopril (PRINIVIL,ZESTRIL) 10 MG tablet, TAKE 1 TABLET BY MOUTH DAILY, Disp: 90 tablet, Rfl: 0 .  metFORMIN (GLUCOPHAGE) 500 MG tablet, Take 1 tablet (500 mg total) by mouth 2 (two) times daily with a meal., Disp: 180 tablet, Rfl: 3 .  metoprolol tartrate (LOPRESSOR) 25 MG tablet, Take 1 tablet (25 mg total) by mouth 2 (two) times daily. <PLEASE MAKE APPOINTMENT FOR REFILLS>, Disp: 180 tablet, Rfl: 0 .  nitroGLYCERIN (NITROSTAT) 0.4 MG SL tablet, Place 1 tablet (0.4 mg total) under the tongue every 5 (five) minutes as needed for chest pain., Disp: 25 tablet, Rfl: 6  EXAM:  Vitals:   04/21/16 1308  BP: 130/80  Pulse: 89  Temp: 98.3 F (36.8 C)    Body mass index is 33.85 kg/m.  GENERAL: vitals reviewed and listed above, alert, oriented, appears well hydrated and in no acute distress  HEENT: atraumatic, conjunttiva clear, no obvious abnormalities on inspection of external nose and ears  NECK: no obvious masses on inspection  LUNGS: clear to auscultation bilaterally, no wheezes, rales or rhonchi, good air movement  CV: HRRR, no peripheral edema  MS: moves all  extremities without noticeable abnormality  PSYCH: tearful, somewhat dramatic, speech and thought processing grossly intact  ASSESSMENT AND PLAN:  Discussed the following assessment and plan:  Stress at work -I am not exactly sure what she was wanting from her visit today, as she declined any medications and also did not seem very interested in pursuing any therapy.  -I did strongly encourage that she seek cognitive behavioral therapy and provided her with a # for calling to schedule this -I did advise that she consider looking for a different job if she hates her current job and that she do the best she can while she is there and communicate openly with her supervisors that she is doing her best and ask for more support for the tasks they are giving her if feels she can not complete them -Advised that if she feels she may not be able to work and has a medical disability related to her stress that she needs to see a psychiatrist for this determination and I provided her with a number of various options including emergency options should she have severe symptoms.  Atypical chest pain - this could have been related to acid reflux or the stress, most likely. -ordered ESE then remembered she sees cardiology and has had testing not too long ago and has hx trigeminy--> will advise she sees her cardiologist about her discomfort. She did not seem very interested in discussing this today but did agree to the stress test.   We will have her follow up closely in 1 month regards to her chronic medical problems and to see if she has changed her mind regarding medications or other treatment options for stress. -Patient advised to return or notify a doctor immediately if symptoms worsen or persist or new concerns arise.  Patient Instructions  BEFORE YOU LEAVE: -follow up: 1 month for diabetes, hypertension, labs  Please schedule counseling to help with stress. Please call the number on the brochure  today.  I also would recommend that you see a psychiatrist:  [facility]: Has a walk in clinic Address: [address], [place], [place] [address]  Hours:  Open ? Closes 5PM  [phone]   [facility]: Address: [address], [place], [place] [address]  Hours:  Open ? Closes 6:30PM  Phone: [phone]   [facility]: Phone [phone]  Seek emergency care if severe symptoms, thought of self harm or feeling out of control.       Colin Benton R., DO

## 2016-04-26 NOTE — Addendum Note (Signed)
Addended by: [name] on: 04/26/2016 03:33 PM   Modules accepted: Orders

## 2016-04-26 NOTE — Progress Notes (Deleted)
HPI:  Here for CPE:  -Concerns and/or follow up today: none Seen recently for stress and atypical chest pain. Referred to psychology, psychiatry and to her cardiologist for her chest discomfort.  PMH significant for DM, HTN, Obesity, Hx smoking.   -Diet: variety of foods, balance and well rounded, larger portion sizes  -Exercise: no regular exercise  -Taking folic acid, vitamin D or calcium: no  -Diabetes and Dyslipidemia Screening:  -Hx of HTN: no  -Vaccines: UTD  -pap history:  -FDLMP:  -sexual activity: yes, female partner, no new partners  -wants STI testing (Hep C if born 63-65): no  -FH breast, colon or ovarian ca: see FH Last mammogram: Last colon cancer screening:  Breast Ca Risk Assessment: -SolutionApps.it  Genetic Counseling Screen: Http://www.breastcancergenesscreen.org/startScreen.aspx  FRAX (50-65):  DEXA (>/= 65):   -Alcohol, Tobacco, drug use: see social history  Review of Systems - no fevers, unintentional weight loss, vision loss, hearing loss, chest pain, sob, hemoptysis, melena, hematochezia, hematuria, genital discharge, changing or concerning skin lesions, bleeding, bruising, loc, thoughts of self harm or SI  Past Medical History:  Diagnosis Date  . Abnormal Pap smear of cervix    remote, > 25 years ago, s/p cone bxs, all normal since per her report  . Essential hypertension 04/08/2015  . GERD (gastroesophageal reflux disease)   . Hyperglycemia    gestational diabetes  . Kidney stones    remote, > 30 years ago  . PVC (premature ventricular contraction) 12/26/2014  . SVT (supraventricular tachycardia) ([place]) 12/26/2014  . Tobacco use     Past Surgical History:  Procedure Laterality Date  . ABLATION     uterine  . CESAREAN SECTION      Family History  Problem Relation Age of Onset  . Hypertension Mother   . Diabetes Mother   . Heart disease Mother 60    MI  . CAD Mother   . Cancer Maternal Aunt   .  Diabetes Maternal Grandfather   . Hypertension Maternal Grandfather   . Cancer Paternal Grandmother   . Colon cancer Neg Hx     Social History   Social History  . Marital status: Married    Spouse name: N/A  . Number of children: N/A  . Years of education: N/A   Social History Main Topics  . Smoking status: Former Smoker    Packs/day: 0.25    Types: Cigarettes    Quit date: 12/19/2014  . Smokeless tobacco: Never Used     Comment: per patient quit 8 weeks ago  . Alcohol use 0.0 oz/week     Comment: 1 glass every 2 weeks  . Drug use: No  . Sexual activity: Not on file   Other Topics Concern  . Not on file   Social History Narrative   Work or School: full time for [employer] Situation: lives with mom and kids home during the summer      Spiritual Beliefs: Christian      Lifestyle: walks 2-3 days per week; diet is fair per her report - avoid fried foods - tries to eat veggies and lean meat           Current Outpatient Prescriptions:  .  lisinopril (PRINIVIL,ZESTRIL) 10 MG tablet, TAKE 1 TABLET BY MOUTH DAILY, Disp: 90 tablet, Rfl: 0 .  metFORMIN (GLUCOPHAGE) 500 MG tablet, Take 1 tablet (500 mg total) by mouth 2 (two) times daily with a meal., Disp: 180 tablet, Rfl: 3 .  metoprolol tartrate (LOPRESSOR) 25 MG tablet, Take 1 tablet (25 mg total) by mouth 2 (two) times daily. <PLEASE MAKE APPOINTMENT FOR REFILLS>, Disp: 180 tablet, Rfl: 0 .  nitroGLYCERIN (NITROSTAT) 0.4 MG SL tablet, Place 1 tablet (0.4 mg total) under the tongue every 5 (five) minutes as needed for chest pain., Disp: 25 tablet, Rfl: 6  EXAM:  There were no vitals filed for this visit.  GENERAL: vitals reviewed and listed below, alert, oriented, appears well hydrated and in no acute distress  HEENT: head atraumatic, PERRLA, normal appearance of eyes, ears, nose and mouth. moist mucus membranes.  NECK: supple, no masses or lymphadenopathy  LUNGS: clear to auscultation bilaterally, no rales,  rhonchi or wheeze  CV: HRRR, no peripheral edema or cyanosis, normal pedal pulses  ABDOMEN: bowel sounds normal, soft, non tender to palpation, no masses, no rebound or guarding  SKIN: no rash or abnormal lesions  MS: normal gait, moves all extremities normally  NEURO: normal gait, speech and thought processing grossly intact, muscle tone grossly intact throughout  PSYCH: normal affect, pleasant and cooperative  ASSESSMENT AND PLAN:  Discussed the following assessment and plan:  There are no diagnoses linked to this encounter.  -Discussed and advised all [place] preventive services health task force level A and B recommendations for age, sex and risks.  -Advised at least 150 minutes of exercise per week and a healthy diet with avoidance of (less then 1 serving per week) processed foods, white starches, red meat, fast foods and sweets and consisting of: * 5-9 servings of fresh fruits and vegetables (not corn or potatoes) *nuts and seeds, beans *olives and olive oil *lean meats such as fish and white chicken  *whole grains  -labs, studies and vaccines per orders this encounter  No orders of the defined types were placed in this encounter.   Patient advised to return to clinic immediately if symptoms worsen or persist or new concerns.  There are no Patient Instructions on file for this visit.  No Follow-up on file.  Colin Benton R., DO

## 2016-04-27 ENCOUNTER — Encounter: Payer: Managed Care, Other (non HMO) | Admitting: Family Medicine

## 2016-04-28 ENCOUNTER — Encounter: Payer: Managed Care, Other (non HMO) | Admitting: Family Medicine

## 2016-05-22 ENCOUNTER — Ambulatory Visit: Payer: Managed Care, Other (non HMO) | Admitting: Family Medicine

## 2016-05-28 ENCOUNTER — Encounter: Payer: Managed Care, Other (non HMO) | Admitting: Family Medicine

## 2016-06-08 ENCOUNTER — Other Ambulatory Visit: Payer: Self-pay | Admitting: Cardiovascular Disease

## 2016-06-08 NOTE — Telephone Encounter (Signed)
Refill Request.  

## 2016-06-08 NOTE — Telephone Encounter (Signed)
Rx request sent to pharmacy.  

## 2016-06-26 ENCOUNTER — Ambulatory Visit (INDEPENDENT_AMBULATORY_CARE_PROVIDER_SITE_OTHER): Payer: Managed Care, Other (non HMO) | Admitting: Family Medicine

## 2016-06-26 ENCOUNTER — Encounter: Payer: Self-pay | Admitting: Family Medicine

## 2016-06-26 VITALS — BP 130/90 | HR 55 | Temp 98.6°F | Wt 184.2 lb

## 2016-06-26 DIAGNOSIS — R1011 Right upper quadrant pain: Secondary | ICD-10-CM | POA: Diagnosis not present

## 2016-06-26 NOTE — Patient Instructions (Signed)
Keep eating bland- avoid high fat foods We will set up abdominal ultrasound to further assess Follow up immediately for any fever, vomiting, or worsening pain.

## 2016-06-26 NOTE — Progress Notes (Signed)
Subjective:     Patient ID: Raven Weaver, female   DOB: Jul 11, 1960, 56 y.o.   MRN: [id number]  HPI Patient seen as a work in today with about one month history of intermittent abdominal pain. Her pain is mostly epigastric and right upper quadrant and is very episodic. She describes as somewhat of achy discomfort that frequently radiates to her back around her scapular region. No nausea or vomiting. Appetite has been normal. No weight changes. No stool changes.  Pain has been somewhat progressive in terms of frequency and severity and starting to awaken her sometimes at night. She's been trying to eat bland diet. She had one episode recently after eating just an apple had severe pain. Pain usually last 30-40 minutes. No history of ulcer. No melena. No regular nonsteroidal use. She has some chronic loose stools which she attributes to metformin.  Past Medical History:  Diagnosis Date  . Abnormal Pap smear of cervix    remote, > 25 years ago, s/p cone bxs, all normal since per her report  . Essential hypertension 04/08/2015  . GERD (gastroesophageal reflux disease)   . Hyperglycemia    gestational diabetes  . Kidney stones    remote, > 30 years ago  . PVC (premature ventricular contraction) 12/26/2014  . SVT (supraventricular tachycardia) ([place]) 12/26/2014  . Tobacco use    Past Surgical History:  Procedure Laterality Date  . ABLATION     uterine  . CESAREAN SECTION      reports that she quit smoking about 18 months ago. Her smoking use included Cigarettes. She smoked 0.25 packs per day. She has never used smokeless tobacco. She reports that she drinks alcohol. She reports that she does not use drugs. family history includes CAD in her mother; Cancer in her maternal aunt and paternal grandmother; Diabetes in her maternal grandfather and mother; Heart disease (age of onset: 35) in her mother; Hypertension in her maternal grandfather and mother. No Known Allergies      Review of  Systems  Constitutional: Negative for appetite change, chills, fever and unexpected weight change.  HENT: Negative for trouble swallowing.   Respiratory: Negative for cough and shortness of breath.   Cardiovascular: Negative for chest pain.  Gastrointestinal: Positive for abdominal pain. Negative for abdominal distention, blood in stool, constipation, nausea and vomiting.  Genitourinary: Negative for dysuria.  Skin: Negative for rash.  Neurological: Negative for weakness.       Objective:   Physical Exam  Constitutional: She appears well-developed and well-nourished.  Neck: Neck supple.  Cardiovascular: Normal rate and regular rhythm.   Pulmonary/Chest: Effort normal and breath sounds normal. No respiratory distress. She has no wheezes. She has no rales.  Abdominal: Soft. Bowel sounds are normal. She exhibits no distension and no mass. There is tenderness. There is no rebound and no guarding.  Mild tenderness to palpation right upper quadrant. No masses. No hepatomegaly       Assessment:     Patient presents with one month history of intermittent pain mostly right upper quadrant with frequent radiation toward the scapular region. Rule out symptomatic gallstones    Plan:     -Continue with bland/low fat diet -Set up abdominal ultrasound to further assess -Follow-up immediately for any fever, vomiting, or worsening pain  Eulas Post MD [facility]

## 2016-07-07 ENCOUNTER — Other Ambulatory Visit: Payer: Managed Care, Other (non HMO)

## 2016-07-10 ENCOUNTER — Other Ambulatory Visit: Payer: Managed Care, Other (non HMO)

## 2016-07-22 ENCOUNTER — Other Ambulatory Visit: Payer: Managed Care, Other (non HMO)

## 2016-07-25 ENCOUNTER — Other Ambulatory Visit: Payer: Self-pay | Admitting: Cardiovascular Disease

## 2016-07-27 NOTE — Telephone Encounter (Signed)
Please review for refill. Thanks!  

## 2016-07-27 NOTE — Telephone Encounter (Signed)
Rx(s) sent to pharmacy electronically.  

## 2016-08-20 ENCOUNTER — Encounter: Payer: Managed Care, Other (non HMO) | Admitting: Family Medicine

## 2016-08-27 ENCOUNTER — Other Ambulatory Visit: Payer: Self-pay

## 2016-08-27 MED ORDER — METOPROLOL TARTRATE 25 MG PO TABS: 25.0000 mg | ORAL_TABLET | Freq: Two times a day (BID) | ORAL | 0 refills | Status: DC

## 2016-09-03 ENCOUNTER — Other Ambulatory Visit: Payer: Self-pay | Admitting: Family Medicine

## 2016-09-27 ENCOUNTER — Other Ambulatory Visit: Payer: Self-pay | Admitting: Cardiovascular Disease

## 2016-09-28 ENCOUNTER — Telehealth: Payer: Self-pay | Admitting: Family Medicine

## 2016-09-28 ENCOUNTER — Telehealth: Payer: Self-pay | Admitting: Cardiovascular Disease

## 2016-09-28 MED ORDER — METOPROLOL TARTRATE 25 MG PO TABS: 25.0000 mg | ORAL_TABLET | Freq: Two times a day (BID) | ORAL | 0 refills | Status: DC

## 2016-09-28 NOTE — Telephone Encounter (Signed)
metoprolol tartrate (LOPRESSOR) 25 MG tablet  Pt request refill. States 2 pills left and pt states no insurance she is self pay and cannot afford to come in for a dr appt for a new script. Please advise. [facility]. Pt ph [phone].

## 2016-09-28 NOTE — Telephone Encounter (Signed)
Spoke with pt and advised that she should contact cardiology for refill as well as needing follow up with them. She voiced understanding. Nothing further needed at this time.

## 2016-09-28 NOTE — Telephone Encounter (Signed)
Refill Request.  

## 2016-09-28 NOTE — Telephone Encounter (Signed)
Spoke with pt, Follow up scheduled and Refill sent to the pharmacy electronically.

## 2016-09-28 NOTE — Telephone Encounter (Signed)
Pt's Metoprolol is prescribed by Cardiology. Pt states that she does not have insurance and cannot afford follow up visit there. She is requesting to have medication refilled.  Dr. Maudie Mercury - Please advise. Thanks!

## 2016-09-28 NOTE — Telephone Encounter (Signed)
New message    Pt is calling about her medication. She said she needs the doctor permission to have medication refilled.

## 2016-09-28 NOTE — Telephone Encounter (Signed)
I am sorry to hear she is having insurance issues.   So that she can get the best care during this time, I advise she notify cardiology of her need for refill, any current symptoms and her difficulty with insurance so that they can help her. If she has difficulty in contacting her cardiologist please have her let us know so that we can assist. Thank you. She was having some symptoms at her last visit her and we advised her to follow up with cardiology. She also is past due on recommended heart testing her cardiologist advised and follow up with her cardiologist.

## 2016-10-03 ENCOUNTER — Other Ambulatory Visit: Payer: Self-pay | Admitting: Family Medicine

## 2016-10-14 ENCOUNTER — Other Ambulatory Visit: Payer: Self-pay | Admitting: Family Medicine

## 2016-10-23 ENCOUNTER — Ambulatory Visit (INDEPENDENT_AMBULATORY_CARE_PROVIDER_SITE_OTHER): Payer: Self-pay | Admitting: Cardiovascular Disease

## 2016-10-23 ENCOUNTER — Encounter (INDEPENDENT_AMBULATORY_CARE_PROVIDER_SITE_OTHER): Payer: Self-pay

## 2016-10-23 ENCOUNTER — Encounter: Payer: Self-pay | Admitting: Cardiovascular Disease

## 2016-10-23 VITALS — BP 142/67 | HR 80 | Ht 64.0 in | Wt 186.4 lb

## 2016-10-23 DIAGNOSIS — I493 Ventricular premature depolarization: Secondary | ICD-10-CM

## 2016-10-23 DIAGNOSIS — Z5181 Encounter for therapeutic drug level monitoring: Secondary | ICD-10-CM

## 2016-10-23 DIAGNOSIS — I10 Essential (primary) hypertension: Secondary | ICD-10-CM

## 2016-10-23 MED ORDER — METOPROLOL TARTRATE 50 MG PO TABS: 50.0000 mg | ORAL_TABLET | Freq: Two times a day (BID) | ORAL | 5 refills | Status: DC

## 2016-10-23 NOTE — Progress Notes (Signed)
Cardiology Office Note   Date:  10/23/2016   ID:  Raven Weaver, DOB [birth year], MRN [id number]  PCP:  [name], DO  Cardiologist:   Skeet Latch, MD   Chief Complaint  Patient presents with  . Follow-up     Patient ID: Raven Weaver is a 56 y.o. female with diabetes and prior tobacco abuse who presents for follow up on PVCs. She was initially seen in 12/2014 when she was noted to have frequent PVCs during a colonoscopy.  Her procedure was cancelled and she was referred to cardiology.  She saw her PCP, Dr. Alysia Penna, later that day.  At that time her thyroid function was normal, as was her renal function and electrolytes.  However, her WBC was 16.3 and hemoglobin A1c was 7%.   She was referred for exercise Myoview 12/2014 that was negative for ischemia.  She had an echo 02/2015 that showed LVEF 55-60% and mild AR.  She also had a 24 hour Holter that showed 18% monomorphic PVCs.  She was started on metoprolol 12.5 mg bid.  At her last appointment this was increased due to poorly controlled hypertension. She was requested to repeat her Holter monitor but this did not occur.  She has been feeling well and denies chest pain Or shortness of breath. She occasionally notes palpitations, though not often. She hasn't noted any lightheadedness or dizziness. Raven Weaver hasn't been exercising much lately but plans to start walking with a friend soon. She denies lower extremity edema, orthopnea, or PND.  She quit smoking 06/2015.   Past Medical History:  Diagnosis Date  . Abnormal Pap smear of cervix    remote, > 25 years ago, s/p cone bxs, all normal since per her report  . Essential hypertension 04/08/2015  . GERD (gastroesophageal reflux disease)   . Hyperglycemia    gestational diabetes  . Kidney stones    remote, > 30 years ago  . PVC (premature ventricular contraction) 12/26/2014  . SVT (supraventricular tachycardia) ([place]) 12/26/2014  . Tobacco use     Past Surgical  History:  Procedure Laterality Date  . ABLATION     uterine  . CESAREAN SECTION       Current Outpatient Prescriptions  Medication Sig Dispense Refill  . lisinopril (PRINIVIL,ZESTRIL) 10 MG tablet TAKE 1 TABLET BY MOUTH DAILY 90 tablet 0  . metFORMIN (GLUCOPHAGE) 500 MG tablet TAKE 1 TABLET BY MOUTH TWICE A DAY WITH FOOD 60 tablet 0  . metoprolol tartrate (LOPRESSOR) 25 MG tablet Take 1 tablet (25 mg total) by mouth 2 (two) times daily. 180 tablet 0  . NITROSTAT 0.4 MG SL tablet PLACE 1 TABLET (0.4 MG TOTAL) UNDER THE TONGUE EVERY 5 (FIVE) MINUTES AS NEEDED FOR CHEST PAIN. 25 tablet 2   No current facility-administered medications for this visit.     Allergies:   Patient has no known allergies.    Social History:  The patient  reports that she quit smoking about 22 months ago. Her smoking use included Cigarettes. She smoked 0.25 packs per day. She has never used smokeless tobacco. She reports that she drinks alcohol. She reports that she does not use drugs.   Family History:  The patient's family history includes CAD in her mother; Cancer in her maternal aunt and paternal grandmother; Diabetes in her maternal grandfather and mother; Heart disease (age of onset: 79) in her mother; Hypertension in her maternal grandfather and mother.    ROS:  Please see  the history of present illness.   Otherwise, review of systems are positive for none.   All other systems are reviewed and negative.    PHYSICAL EXAM: VS:  BP (!) 142/67   Pulse 80   Ht 5\' 4"  (1.626 m)   Wt 84.6 kg (186 lb 6.4 oz)   LMP 02/16/2006   BMI 32.00 kg/m  , BMI Body mass index is 32 kg/m. GENERAL:  Well appearing HEENT: Pupils equal round and reactive, fundi not visualized, oral mucosa unremarkable NECK:  No jugular venous distention, waveform within normal limits, carotid upstroke brisk and symmetric, no bruits, no thyromegaly  LUNGS:  Clear to auscultation bilaterally HEART:  Mostly regular with frequent ectopy.    PMI not displaced or sustained,S1 and S2 within normal limits, no S3, no S4, no clicks, no rubs, no murmurs ABD:  Flat, positive bowel sounds normal in frequency in pitch, no bruits, no rebound, no guarding, no midline pulsatile mass, no hepatomegaly, no splenomegaly EXT:  2 plus pulses throughout, no edema, no cyanosis no clubbing SKIN:  No rashes no nodules NEURO:  Cranial nerves II through XII grossly intact, motor grossly intact throughout PSYCH:  Cognitively intact, oriented to person place and time   EKG:  EKG is ordered today. 10/23/16: Sinus rhythm. Rate 80 bpm. Frequent PVCs. Ventricular trigeminy.  Exercise Myoview 01/15/15:     There was no ST segment deviation noted during stress.  This is a low risk study.  Low risk stress nuclear study with a small, mild, fixed apical defect likely related to apical thinning; no ischemia; study not gated due to ventricular ectopy.  Echo 02/19/15: Study Conclusions  - Left ventricle: The cavity size was normal. There was moderate concentric hypertrophy. Systolic function was normal. The estimated ejection fraction was in the range of 55% to 60%. Wall motion was normal; there were no regional wall motion abnormalities. - Aortic valve: There was mild regurgitation. - Left atrium: The atrium was mildly dilated. - Atrial septum: No defect or patent foramen ovale was identified.  24 Hour Holter Monitor 02/22/15:  Quality: Fair. Baseline artifact. Predominant rhythm: sinus rhythm Average heart rate: 96 bpm Max heart rate: 115 bpm Min heart rate: 81 bpm Pauses >2.5 seconds: 0 Ventricular ectopics: 25,201 (4159 isolated, 55 bigemeny, 220,868 trigemeny, 117 quadrigeminy events)  Percentage of ectopic beats: 18.4 Morphology: monomorphic Supraventricular ectopics: 3  Recent Labs: 12/16/2015: BUN 12; Creatinine, Ser 0.91; Hemoglobin 13.9; Platelets 319.0; Potassium 4.2; Sodium 140    Lipid Panel No results found for: CHOL,  TRIG, HDL, CHOLHDL, VLDL, LDLCALC, LDLDIRECT    Wt Readings from Last 3 Encounters:  10/23/16 84.6 kg (186 lb 6.4 oz)  06/26/16 83.6 kg (184 lb 3.2 oz)  04/21/16 89.4 kg (197 lb 3.2 oz)      ASSESSMENT AND PLAN:  # PVCs: 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  She does not typically feel any palpitations. We will increase metoprolol to 50mg  bid.   # Tobacco abuse: Raven Weaver was congratulated on smoking cessation.  # Hypertension:  Blood pressure is above goal both initially on repeat. Continue lisinopril and increase metoprolol to 50 mg twice daily.  # CV Disease Prevention: Check fasting lipids and CMP.   Current medicines are reviewed at length with the patient today.  The patient does not have concerns regarding medicines.  The following changes have been made:  Increase metoprolol to 50 mg twice a day. Labs/ tests ordered today include:   No orders of  the defined types were placed in this encounter.   Disposition:   FU with Charliee Krenz C. [name], MD in 2 months.   Signed, Skeet Latch, MD  10/23/2016 10:50 AM    [place]

## 2016-10-23 NOTE — Patient Instructions (Signed)
Medication Instructions:  INCREASE YOUR METOPROLOL TO 50 MG TWICE A DAY   Labwork: FASTING LP/CMET SOON   Testing/Procedures: NONE  Follow-Up: Your physician recommends that you schedule a follow-up appointment in: 2 MONTH OV  If you need a refill on your cardiac medications before your next appointment, please call your pharmacy.

## 2016-11-04 ENCOUNTER — Other Ambulatory Visit: Payer: Self-pay | Admitting: Family Medicine

## 2016-11-05 NOTE — Telephone Encounter (Signed)
Last OV 10/23/16, f/u 12/2016

## 2016-12-24 ENCOUNTER — Ambulatory Visit: Payer: Self-pay | Admitting: Cardiovascular Disease

## 2017-01-03 ENCOUNTER — Other Ambulatory Visit: Payer: Self-pay | Admitting: Cardiovascular Disease

## 2017-01-04 NOTE — Telephone Encounter (Signed)
Please review for refill, thanks ! 

## 2017-01-18 ENCOUNTER — Other Ambulatory Visit: Payer: Self-pay | Admitting: Family Medicine

## 2017-01-19 ENCOUNTER — Ambulatory Visit: Payer: Self-pay | Admitting: Cardiovascular Disease

## 2017-01-26 ENCOUNTER — Ambulatory Visit: Payer: Self-pay | Admitting: Cardiovascular Disease

## 2017-02-14 ENCOUNTER — Other Ambulatory Visit: Payer: Self-pay | Admitting: Family Medicine

## 2017-02-16 ENCOUNTER — Other Ambulatory Visit: Payer: Self-pay | Admitting: Family Medicine

## 2017-03-09 ENCOUNTER — Encounter (HOSPITAL_COMMUNITY): Payer: Self-pay

## 2017-03-09 ENCOUNTER — Other Ambulatory Visit: Payer: Self-pay

## 2017-03-09 ENCOUNTER — Emergency Department (HOSPITAL_COMMUNITY): Payer: Self-pay

## 2017-03-09 ENCOUNTER — Observation Stay (HOSPITAL_COMMUNITY)
Admission: EM | Admit: 2017-03-09 | Discharge: 2017-03-10 | Disposition: A | Discharge status: Home / Self Care | Payer: Self-pay | Attending: Internal Medicine | Admitting: Internal Medicine

## 2017-03-09 DIAGNOSIS — R0789 Other chest pain: Principal | ICD-10-CM | POA: Diagnosis present

## 2017-03-09 DIAGNOSIS — Z23 Encounter for immunization: Secondary | ICD-10-CM | POA: Insufficient documentation

## 2017-03-09 DIAGNOSIS — Z79899 Other long term (current) drug therapy: Secondary | ICD-10-CM | POA: Insufficient documentation

## 2017-03-09 DIAGNOSIS — K219 Gastro-esophageal reflux disease without esophagitis: Secondary | ICD-10-CM | POA: Diagnosis present

## 2017-03-09 DIAGNOSIS — E119 Type 2 diabetes mellitus without complications: Secondary | ICD-10-CM

## 2017-03-09 DIAGNOSIS — Z7984 Long term (current) use of oral hypoglycemic drugs: Secondary | ICD-10-CM | POA: Insufficient documentation

## 2017-03-09 DIAGNOSIS — Z87891 Personal history of nicotine dependence: Secondary | ICD-10-CM | POA: Insufficient documentation

## 2017-03-09 DIAGNOSIS — I493 Ventricular premature depolarization: Secondary | ICD-10-CM | POA: Diagnosis present

## 2017-03-09 DIAGNOSIS — I10 Essential (primary) hypertension: Secondary | ICD-10-CM | POA: Diagnosis present

## 2017-03-09 HISTORY — DX: Personal history of urinary calculi: Z87.442

## 2017-03-09 HISTORY — DX: Type 2 diabetes mellitus without complications: E11.9

## 2017-03-09 LAB — BASIC METABOLIC PANEL
Anion gap: 12 (ref 5–15)
BUN: 8 mg/dL (ref 6–20)
CO2: 21 mmol/L — ABNORMAL LOW (ref 22–32)
Calcium: 9.7 mg/dL (ref 8.9–10.3)
Chloride: 106 mmol/L (ref 101–111)
Creatinine, Ser: 0.76 mg/dL (ref 0.44–1.00)
GFR calc Af Amer: >60 mL/min (ref >60)
Glucose, Bld: 135 mg/dL — ABNORMAL HIGH (ref 65–99)
Potassium: 3.9 mmol/L (ref 3.5–5.1)
SODIUM: 139 mmol/L (ref 135–145)

## 2017-03-09 LAB — I-STAT TROPONIN, ED: Troponin i, poc: 0 ng/mL (ref 0.00–0.08)

## 2017-03-09 LAB — CBC
HCT: 41.1 % (ref 36.0–46.0)
Hemoglobin: 14 g/dL (ref 12.0–15.0)
MCH: 30.8 pg (ref 26.0–34.0)
MCHC: 34.1 g/dL (ref 30.0–36.0)
MCV: 90.5 fL (ref 78.0–100.0)
PLATELETS: 372 10*3/uL (ref 150–400)
RBC: 4.54 MIL/uL (ref 3.87–5.11)
RDW: 12.7 % (ref 11.5–15.5)
WBC: 10.1 10*3/uL (ref 4.0–10.5)

## 2017-03-09 LAB — TROPONIN I

## 2017-03-09 MED ORDER — ENOXAPARIN SODIUM 40 MG/0.4ML ~~LOC~~ SOLN
40.0000 mg | SUBCUTANEOUS | Status: DC
  Administered 2017-03-09: 40 mg via SUBCUTANEOUS
  Filled 2017-03-09 (×2): qty 0.4

## 2017-03-09 MED ORDER — INSULIN ASPART 100 UNIT/ML ~~LOC~~ SOLN
0.0000 [IU] | Freq: Three times a day (TID) | SUBCUTANEOUS | Status: DC
  Administered 2017-03-10: 1 [IU] via SUBCUTANEOUS

## 2017-03-09 MED ORDER — METOPROLOL TARTRATE 25 MG PO TABS
25.0000 mg | ORAL_TABLET | Freq: Two times a day (BID) | ORAL | Status: DC
  Administered 2017-03-09 – 2017-03-10 (×2): 25 mg via ORAL
  Filled 2017-03-09 (×2): qty 1

## 2017-03-09 MED ORDER — INSULIN ASPART 100 UNIT/ML ~~LOC~~ SOLN: 0.0000 [IU] | Freq: Every day | SUBCUTANEOUS | Status: DC

## 2017-03-09 MED ORDER — HYDRALAZINE HCL 20 MG/ML IJ SOLN: 5.0000 mg | INTRAMUSCULAR | Status: DC | PRN

## 2017-03-09 MED ORDER — NITROGLYCERIN 0.4 MG SL SUBL: 0.4000 mg | SUBLINGUAL_TABLET | SUBLINGUAL | Status: DC | PRN

## 2017-03-09 MED ORDER — ONDANSETRON HCL 4 MG/2ML IJ SOLN: 4.0000 mg | Freq: Four times a day (QID) | INTRAMUSCULAR | Status: DC | PRN

## 2017-03-09 MED ORDER — ALPRAZOLAM 0.25 MG PO TABS: 0.2500 mg | ORAL_TABLET | Freq: Two times a day (BID) | ORAL | Status: DC | PRN

## 2017-03-09 MED ORDER — LISINOPRIL 10 MG PO TABS
10.0000 mg | ORAL_TABLET | Freq: Every day | ORAL | Status: DC
Start: 2017-03-10 — End: 2017-03-10
  Administered 2017-03-10: 10 mg via ORAL
  Filled 2017-03-09: qty 1

## 2017-03-09 MED ORDER — ZOLPIDEM TARTRATE 5 MG PO TABS: 5.0000 mg | ORAL_TABLET | Freq: Every evening | ORAL | Status: DC | PRN

## 2017-03-09 MED ORDER — ACETAMINOPHEN 325 MG PO TABS: 650.0000 mg | ORAL_TABLET | ORAL | Status: DC | PRN

## 2017-03-09 MED ORDER — MORPHINE SULFATE (PF) 4 MG/ML IV SOLN: 2.0000 mg | INTRAVENOUS | Status: DC | PRN

## 2017-03-09 MED ORDER — SODIUM CHLORIDE 0.9 % IV SOLN
INTRAVENOUS | Status: DC
  Administered 2017-03-09 – 2017-03-10 (×2): via INTRAVENOUS

## 2017-03-09 MED ORDER — INFLUENZA VAC SPLIT QUAD 0.5 ML IM SUSY
0.5000 mL | PREFILLED_SYRINGE | INTRAMUSCULAR | Status: AC
  Administered 2017-03-10: 0.5 mL via INTRAMUSCULAR

## 2017-03-09 MED ORDER — ASPIRIN 81 MG PO CHEW
324.0000 mg | CHEWABLE_TABLET | Freq: Once | ORAL | Status: AC
  Administered 2017-03-09: 324 mg via ORAL
  Filled 2017-03-09: qty 4

## 2017-03-09 MED ORDER — ASPIRIN 81 MG PO CHEW
324.0000 mg | CHEWABLE_TABLET | Freq: Every day | ORAL | Status: DC
  Administered 2017-03-10: 324 mg via ORAL
  Filled 2017-03-09: qty 4

## 2017-03-09 MED ORDER — PANTOPRAZOLE SODIUM 40 MG PO TBEC: 40.0000 mg | DELAYED_RELEASE_TABLET | Freq: Every day | ORAL | Status: DC | PRN

## 2017-03-09 NOTE — ED Provider Notes (Signed)
[name] Provider Note   CSN: [id number] Arrival date & time: 03/09/17  1612     History   Chief Complaint Chief Complaint  Patient presents with  . Chest Pain    HPI Raven Weaver is a 57 y.o. female.  Patient states that she has been having chest pressure off and on for the last day.  She took some nitro earlier today and that relieved the discomfort   The history is provided by the patient. No language interpreter was used.  Chest Pain   This is a new problem. The current episode started 12 to 24 hours ago. The problem occurs constantly. The problem has been resolved. The pain is associated with exertion. The pain is present in the substernal region. The pain is at a severity of 5/10. The pain is moderate. The quality of the pain is described as dull and pressure-like. Pertinent negatives include no abdominal pain, no back pain, no cough and no headaches.  Pertinent negatives for past medical history include no seizures.    Past Medical History:  Diagnosis Date  . Abnormal Pap smear of cervix    remote, > 25 years ago, s/p cone bxs, all normal since per her report  . Essential hypertension 04/08/2015  . GERD (gastroesophageal reflux disease)   . Hyperglycemia    gestational diabetes  . Kidney stones    remote, > 30 years ago  . PVC (premature ventricular contraction) 12/26/2014  . SVT (supraventricular tachycardia) ([place]) 12/26/2014  . Tobacco use     Patient Active Problem List   Diagnosis Date Noted  . Chest pressure 03/09/2017  . Type 2 diabetes mellitus without complication, without long-term current use of insulin ([place]) 08/12/2015  . Essential hypertension 04/08/2015  . PVC (premature ventricular contraction) 12/26/2014  . Seasonal allergies 08/07/2014  . GERD (gastroesophageal reflux disease) 08/07/2014    Past Surgical History:  Procedure Laterality Date  . ABLATION     uterine  . CESAREAN SECTION       OB History    No data available       Home Medications    Prior to Admission medications   Medication Sig Start Date End Date Taking? Authorizing Provider  lisinopril (PRINIVIL,ZESTRIL) 10 MG tablet TAKE 1 TABLET BY MOUTH EVERY DAY **NEEDS APPOINTMENT** 02/17/17  Yes Colin Benton R, DO  metFORMIN (GLUCOPHAGE) 500 MG tablet TAKE 1 TABLET BY MOUTH TWICE A DAY WITH FOOD 02/15/17  Yes Colin Benton R, DO  metoprolol tartrate (LOPRESSOR) 25 MG tablet TAKE 1 TABLET BY MOUTH TWICE A DAY 01/04/17  Yes Skeet Latch, MD  NITROSTAT 0.4 MG SL tablet PLACE 1 TABLET (0.4 MG TOTAL) UNDER THE TONGUE EVERY 5 (FIVE) MINUTES AS NEEDED FOR CHEST PAIN. 06/08/16  Yes Skeet Latch, MD  metoprolol tartrate (LOPRESSOR) 50 MG tablet Take 1 tablet (50 mg total) by mouth 2 (two) times daily. Patient not taking: Reported on 03/09/2017 10/23/16   Skeet Latch, MD    Family History Family History  Problem Relation Age of Onset  . Hypertension Mother   . Diabetes Mother   . Heart disease Mother 45       MI  . CAD Mother   . Cancer Maternal Aunt   . Diabetes Maternal Grandfather   . Hypertension Maternal Grandfather   . Cancer Paternal Grandmother   . Colon cancer Neg Hx     Social History Social History   Tobacco Use  . Smoking status: Former  Smoker    Packs/day: 0.25    Types: Cigarettes    Last attempt to quit: 12/19/2014    Years since quitting: 2.2  . Smokeless tobacco: Never Used  . Tobacco comment: per patient quit 8 weeks ago  Substance Use Topics  . Alcohol use: Yes    Alcohol/week: 0.0 oz    Comment: 1 glass every 2 weeks  . Drug use: No     Allergies   Patient has no known allergies.   Review of Systems Review of Systems  Constitutional: Negative for appetite change and fatigue.  HENT: Negative for congestion, ear discharge and sinus pressure.   Eyes: Negative for discharge.  Respiratory: Negative for cough.   Cardiovascular: Positive for chest pain.   Gastrointestinal: Negative for abdominal pain and diarrhea.  Genitourinary: Negative for frequency and hematuria.  Musculoskeletal: Negative for back pain.  Skin: Negative for rash.  Neurological: Negative for seizures and headaches.  Psychiatric/Behavioral: Negative for hallucinations.     Physical Exam Updated Vital Signs BP (!) 142/70   Pulse 72   Temp 98 F (36.7 C) (Oral)   Resp (!) 22   Ht 5\' 4"  (1.626 m)   Wt 83.9 kg (185 lb)   LMP 02/16/2006   SpO2 100%   BMI 31.76 kg/m   Physical Exam  Constitutional: She is oriented to person, place, and time. She appears well-developed.  HENT:  Head: Normocephalic.  Eyes: Conjunctivae and EOM are normal. No scleral icterus.  Neck: Neck supple. No thyromegaly present.  Cardiovascular: Normal rate and regular rhythm. Exam reveals no gallop and no friction rub.  No murmur heard. Pulmonary/Chest: No stridor. She has no wheezes. She has no rales. She exhibits no tenderness.  Abdominal: She exhibits no distension. There is no tenderness. There is no rebound.  Musculoskeletal: Normal range of motion. She exhibits no edema.  Lymphadenopathy:    She has no cervical adenopathy.  Neurological: She is oriented to person, place, and time. She exhibits normal muscle tone. Coordination normal.  Skin: No rash noted. No erythema.  Psychiatric: She has a normal mood and affect. Her behavior is normal.     ED Treatments / Results  Labs (all labs ordered are listed, but only abnormal results are displayed) Labs Reviewed  BASIC METABOLIC PANEL - Abnormal; Notable for the following components:      Result Value   CO2 21 (*)    Glucose, Bld 135 (*)    All other components within normal limits  CBC  RAPID URINE DRUG SCREEN, HOSP PERFORMED  HEMOGLOBIN A1C  LIPID PANEL  TROPONIN I  TROPONIN I  TROPONIN I  HIV ANTIBODY (ROUTINE TESTING)  I-STAT TROPONIN, ED    EKG  EKG Interpretation  Date/Time:  Tuesday March 09 2017 16:22:42  EST Ventricular Rate:  78 PR Interval:  160 QRS Duration: 84 QT Interval:  384 QTC Calculation: 437 R Axis:   10 Text Interpretation:  Sinus rhythm with frequent Premature ventricular complexes Minimal voltage criteria for LVH, may be normal variant Borderline ECG Confirmed by Milton Ferguson [phone]) on 03/09/2017 5:29:04 PM       Radiology Dg Chest 2 View  Result Date: 03/09/2017 CLINICAL DATA:  Chest pain and cardiac palpitations EXAM: CHEST  2 VIEW COMPARISON:  None. FINDINGS: Lungs are clear. Heart size and pulmonary vascularity are normal. No adenopathy. No pneumothorax. No bone lesions. IMPRESSION: No edema or consolidation. Electronically Signed   By: Lowella Grip III M.D.   On: 03/09/2017 16:50  Procedures Procedures (including critical care time)  Medications Ordered in ED Medications  aspirin chewable tablet 324 mg (not administered)  lisinopril (PRINIVIL,ZESTRIL) tablet 10 mg (not administered)  metoprolol tartrate (LOPRESSOR) tablet 25 mg (not administered)  nitroGLYCERIN (NITROSTAT) SL tablet 0.4 mg (not administered)  morphine 4 MG/ML injection 2 mg (not administered)  pantoprazole (PROTONIX) EC tablet 40 mg (not administered)  insulin aspart (novoLOG) injection 0-9 Units (not administered)  insulin aspart (novoLOG) injection 0-5 Units (not administered)  acetaminophen (TYLENOL) tablet 650 mg (not administered)  ondansetron (ZOFRAN) injection 4 mg (not administered)  enoxaparin (LOVENOX) injection 40 mg (not administered)  zolpidem (AMBIEN) tablet 5 mg (not administered)  ALPRAZolam (XANAX) tablet 0.25 mg (not administered)  hydrALAZINE (APRESOLINE) injection 5 mg (not administered)  aspirin chewable tablet 324 mg (324 mg Oral Given 03/09/17 1740)     Initial Impression / Assessment and Plan / ED Course  I have reviewed the triage vital signs and the nursing notes.  Pertinent labs & imaging results that were available during my care of the patient were  reviewed by me and considered in my medical decision making (see chart for details).     Patient unifocal PVCs and chest pain relieved by nitro.  Troponin was normal.  Patient will be admitted to medicine for rule out  Final Clinical Impressions(s) / ED Diagnoses   Final diagnoses:  Atypical chest pain    ED Discharge Orders    None       Milton Ferguson, MD 03/09/17 1924

## 2017-03-09 NOTE — Plan of Care (Signed)
  Progressing Clinical Measurements: Will remain free from infection 03/09/2017 2316 - Progressing by Colonel Bald, RN Note No s/s of infection noted. Respiratory complications will improve 03/09/2017 2316 - Progressing by Colonel Bald, RN Note No respiratory complications noted.

## 2017-03-09 NOTE — ED Triage Notes (Signed)
Pt presents with onset of mid-sternal heaviness that began last night; pt took NTG with some relief only to begin having palpitations.  Pt reports shortness of breath, denies any nausea.  Pt reports pressure that is intermittent to L arm.

## 2017-03-09 NOTE — ED Provider Notes (Signed)
Patient placed in Quick Look pathway, seen and evaluated   Chief Complaint:chest pain  HPI:   57 year old female presents today with complaints of chest pain.  She notes symptoms started last night with a pressure in her chest no radiation of symptoms no associated shortness of breath.  Patient notes that symptoms are improved with nitroglycerin.  She notes that an hour and half prior to arrival she was having the chest pain, took nitroglycerin which again relieved her symptoms.  ROS: Chest pain (one)  Physical Exam:   Gen: No distress  Neuro: Awake and Alert  Skin: Warm    Focused Exam: Cardiac regular rate and rhythm     Initiation of care has begun. The patient has been counseled on the process, plan, and necessity for staying for the completion/evaluation, and the remainder of the medical screening examination    Francee Gentile 03/09/17 [place], Julie, MD 03/10/17 [phone]

## 2017-03-09 NOTE — H&P (Signed)
History and Physical    Raven Weaver JEH:[id number] DOB: 26-Jun-1960 DOA: 03/09/2017  Referring MD/NP/PA:   PCP: [name], DO   Patient coming from:  The patient is coming from home.  At baseline, pt is independent for most of ADL.   Chief Complaint: Chest pressure  HPI: Raven Weaver is a 57 y.o. female with medical history significant of hypertension, diabetes mellitus, GERD, SVT, PVC, former smoker, who presents with chest pressure.  Patient states that her chest pressure started since last night. It is located in the substernal area, intermittent, 5 out of 10 in severity, radiating to the left arm. She has had more than 10 episodes since started, each time it lasted for about a few minutes, then resolves spontaneously. It is associated with palpitation, but no SOB. Patient denies fever, chills, cough. No tenderness in the calf areas. No recent long distant traveling. Patient denies nausea, vomiting, diarrhea, abdominal pain, symptoms of UTI or unilateral weakness.  ED Course: pt was found to have negative troponin, WBC 10.1, electrolytes renal function okay, temperature normal, no tachycardia, oxygen saturation 99% on room air, negative chest x-ray. Patient is placed on telemetry bed for observation.  Review of Systems:   General: no fevers, chills, no body weight gain,  HEENT: no blurry vision, hearing changes or sore throat Respiratory: no dyspnea, coughing, wheezing CV: has chest pressure and palpitations GI: no nausea, vomiting, abdominal pain, diarrhea, constipation GU: no dysuria, burning on urination, increased urinary frequency, hematuria  Ext: no leg edema Neuro: no unilateral weakness, numbness, or tingling, no vision change or hearing loss Skin: no rash, no skin tear. MSK: No muscle spasm, no deformity, no limitation of range of movement in spin Heme: No easy bruising.  Travel history: No recent long distant travel.  Allergy: No Known  Allergies  Past Medical History:  Diagnosis Date  . Abnormal Pap smear of cervix    remote, > 25 years ago, s/p cone bxs, all normal since per her report  . Essential hypertension 04/08/2015  . GERD (gastroesophageal reflux disease)   . Hyperglycemia    gestational diabetes  . Kidney stones    remote, > 30 years ago  . PVC (premature ventricular contraction) 12/26/2014  . SVT (supraventricular tachycardia) ([place]) 12/26/2014  . Tobacco use     Past Surgical History:  Procedure Laterality Date  . ABLATION     uterine  . CESAREAN SECTION      Social History:  reports that she quit smoking about 2 years ago. Her smoking use included cigarettes. She smoked 0.25 packs per day. she has never used smokeless tobacco. She reports that she drinks alcohol. She reports that she does not use drugs.  Family History:  Family History  Problem Relation Age of Onset  . Hypertension Mother   . Diabetes Mother   . Heart disease Mother 12       MI  . CAD Mother   . Cancer Maternal Aunt   . Diabetes Maternal Grandfather   . Hypertension Maternal Grandfather   . Cancer Paternal Grandmother   . Colon cancer Neg Hx      Prior to Admission medications   Medication Sig Start Date End Date Taking? Authorizing Provider  lisinopril (PRINIVIL,ZESTRIL) 10 MG tablet TAKE 1 TABLET BY MOUTH EVERY DAY **NEEDS APPOINTMENT** 02/17/17  Yes Colin Benton R, DO  metFORMIN (GLUCOPHAGE) 500 MG tablet TAKE 1 TABLET BY MOUTH TWICE A DAY WITH FOOD 02/15/17  Yes [name],  DO  metoprolol tartrate (LOPRESSOR) 25 MG tablet TAKE 1 TABLET BY MOUTH TWICE A DAY 01/04/17  Yes Skeet Latch, MD  NITROSTAT 0.4 MG SL tablet PLACE 1 TABLET (0.4 MG TOTAL) UNDER THE TONGUE EVERY 5 (FIVE) MINUTES AS NEEDED FOR CHEST PAIN. 06/08/16  Yes Skeet Latch, MD  metoprolol tartrate (LOPRESSOR) 50 MG tablet Take 1 tablet (50 mg total) by mouth 2 (two) times daily. Patient not taking: Reported on 03/09/2017 10/23/16   Skeet Latch, MD     Physical Exam: Vitals:   03/09/17 1723 03/09/17 1800 03/09/17 1815 03/09/17 1830  BP: (!) 140/55 (!) 143/95  (!) 142/70  Pulse:  62 (!) 37 72  Resp: 18 17 14  (!) 22  Temp:      TempSrc:      SpO2:  100% 100% 100%  Weight:      Height:       General: Not in acute distress HEENT:       Eyes: PERRL, EOMI, no scleral icterus.       ENT: No discharge from the ears and nose, no pharynx injection, no tonsillar enlargement.        Neck: No JVD, no bruit, no mass felt. Heme: No neck lymph node enlargement. Cardiac: S1/S2, RRR, No murmurs, No gallops or rubs. Respiratory: No rales, wheezing, rhonchi or rubs. GI: Soft, nondistended, nontender, no rebound pain, no organomegaly, BS present. GU: No hematuria Ext: No pitting leg edema bilaterally. 2+DP/PT pulse bilaterally. Musculoskeletal: No joint deformities, No joint redness or warmth, no limitation of ROM in spin. Skin: No rashes.  Neuro: Alert, oriented X3, cranial nerves II-XII grossly intact, moves all extremities normally. Psych: Patient is not psychotic, no suicidal or hemocidal ideation.  Labs on Admission: I have personally reviewed following labs and imaging studies  CBC: Recent Labs  Lab 03/09/17 1633  WBC 10.1  HGB 14.0  HCT 41.1  MCV 90.5  PLT 939   Basic Metabolic Panel: Recent Labs  Lab 03/09/17 1633  NA 139  K 3.9  CL 106  CO2 21*  GLUCOSE 135*  BUN 8  CREATININE 0.76  CALCIUM 9.7   GFR: Estimated Creatinine Clearance: 80.3 mL/min (by C-G formula based on SCr of 0.76 mg/dL). Liver Function Tests: No results for input(s): AST, ALT, ALKPHOS, BILITOT, PROT, ALBUMIN in the last 168 hours. No results for input(s): LIPASE, AMYLASE in the last 168 hours. No results for input(s): AMMONIA in the last 168 hours. Coagulation Profile: No results for input(s): INR, PROTIME in the last 168 hours. Cardiac Enzymes: No results for input(s): CKTOTAL, CKMB, CKMBINDEX, TROPONINI in the last 168 hours. BNP (last 3  results) No results for input(s): PROBNP in the last 8760 hours. HbA1C: No results for input(s): HGBA1C in the last 72 hours. CBG: No results for input(s): GLUCAP in the last 168 hours. Lipid Profile: No results for input(s): CHOL, HDL, LDLCALC, TRIG, CHOLHDL, LDLDIRECT in the last 72 hours. Thyroid Function Tests: No results for input(s): TSH, T4TOTAL, FREET4, T3FREE, THYROIDAB in the last 72 hours. Anemia Panel: No results for input(s): VITAMINB12, FOLATE, FERRITIN, TIBC, IRON, RETICCTPCT in the last 72 hours. Urine analysis: No results found for: COLORURINE, APPEARANCEUR, LABSPEC, PHURINE, GLUCOSEU, HGBUR, BILIRUBINUR, KETONESUR, PROTEINUR, UROBILINOGEN, NITRITE, LEUKOCYTESUR Sepsis Labs: @LABRCNTIP (procalcitonin:4,lacticidven:4) )No results found for this or any previous visit (from the past 240 hour(s)).   Radiological Exams on Admission: Dg Chest 2 View  Result Date: 03/09/2017 CLINICAL DATA:  Chest pain and cardiac palpitations EXAM: CHEST  2 VIEW  COMPARISON:  None. FINDINGS: Lungs are clear. Heart size and pulmonary vascularity are normal. No adenopathy. No pneumothorax. No bone lesions. IMPRESSION: No edema or consolidation. Electronically Signed   By: Lowella Grip III M.D.   On: 03/09/2017 16:50     EKG: Independently reviewed.  Sinus rhythm, QTC 437, Freet in the PVC, bigeminy, LAE.    Assessment/Plan Principal Problem:   Chest pressure Active Problems:   GERD (gastroesophageal reflux disease)   PVC (premature ventricular contraction)   Essential hypertension   Type 2 diabetes mellitus without complication, without long-term current use of insulin ([facility])   Chest pressure: Patient has atypical chest pressure. Her risk factors include former smoking, hypertension and diabetes mellitus and age. Initial troponin negative. Will do chest pain rule out work up.  - will place on Tele bed for obs - cycle CE q6 x3 and repeat EKG in the am  - prn Nitroglycerin,  Morphine, and aspirin - Risk factor stratification: will check FLP, UDS and A1C  - 2d echo - Inpatient non-urgent card consult order was put in [place] and message to [employer] was sent out.  GERD (gastroesophageal reflux disease) -prn protonix  PVC (premature ventricular contraction): this is chronic issues -tele monitoring  HTN:  -Continue home medications: Lisinopril, metoprolol -IV hydralazine prn  Type 2 diabetes mellitus without complication, without long-term current use of insulin ([place]): Last A1c 6.9 on 12/12/14, well controled. Patient is taking metformin at home -SSI -f/u A1c   DVT ppx: SQ Lovenox Code Status: Full code Family Communication:    Yes, patient's mother at bed side Disposition Plan:  Anticipate discharge back to previous home environment Consults called:  none Admission status: Obs / tele     Date of Service 03/09/2017    Ivor Costa [facility] Pager [phone]  If 7PM-7AM, please contact night-coverage [email or web address] Password [facility] 03/09/2017, 7:23 PM

## 2017-03-09 NOTE — ED Notes (Signed)
Report attempted x 1

## 2017-03-10 ENCOUNTER — Other Ambulatory Visit: Payer: Self-pay | Admitting: Physician Assistant

## 2017-03-10 ENCOUNTER — Observation Stay (HOSPITAL_BASED_OUTPATIENT_CLINIC_OR_DEPARTMENT_OTHER): Payer: Self-pay

## 2017-03-10 ENCOUNTER — Encounter (HOSPITAL_COMMUNITY): Payer: Self-pay | Admitting: Cardiology

## 2017-03-10 DIAGNOSIS — E119 Type 2 diabetes mellitus without complications: Secondary | ICD-10-CM

## 2017-03-10 DIAGNOSIS — R0789 Other chest pain: Secondary | ICD-10-CM

## 2017-03-10 DIAGNOSIS — K219 Gastro-esophageal reflux disease without esophagitis: Secondary | ICD-10-CM

## 2017-03-10 DIAGNOSIS — R079 Chest pain, unspecified: Secondary | ICD-10-CM

## 2017-03-10 DIAGNOSIS — I493 Ventricular premature depolarization: Secondary | ICD-10-CM

## 2017-03-10 DIAGNOSIS — I10 Essential (primary) hypertension: Secondary | ICD-10-CM

## 2017-03-10 LAB — MAGNESIUM: MAGNESIUM: 2 mg/dL (ref 1.7–2.4)

## 2017-03-10 LAB — LIPID PANEL
Cholesterol: 140 mg/dL (ref 0–200)
HDL: 39 mg/dL — ABNORMAL LOW (ref >40)
LDL CALC: 81 mg/dL (ref 0–99)
Total CHOL/HDL Ratio: 3.6 RATIO
Triglycerides: 101 mg/dL (ref <150)
VLDL: 20 mg/dL (ref 0–40)

## 2017-03-10 LAB — BASIC METABOLIC PANEL
Anion gap: 11 (ref 5–15)
BUN: 6 mg/dL (ref 6–20)
CO2: 24 mmol/L (ref 22–32)
CREATININE: 0.93 mg/dL (ref 0.44–1.00)
Calcium: 9.5 mg/dL (ref 8.9–10.3)
Chloride: 106 mmol/L (ref 101–111)
Glucose, Bld: 148 mg/dL — ABNORMAL HIGH (ref 65–99)
POTASSIUM: 4.2 mmol/L (ref 3.5–5.1)
SODIUM: 141 mmol/L (ref 135–145)

## 2017-03-10 LAB — ECHOCARDIOGRAM COMPLETE
HEIGHTINCHES: 64 in
WEIGHTICAEL: 2970.04 [oz_av]

## 2017-03-10 LAB — HIV ANTIBODY (ROUTINE TESTING W REFLEX): HIV SCREEN 4TH GENERATION: NONREACTIVE

## 2017-03-10 LAB — NM MYOCAR MULTI W/SPECT W/WALL MOTION / EF
CSEPPHR: 90 {beats}/min
MPHR: 162 {beats}/min
Percent HR: 55 %
Rest HR: 72 {beats}/min

## 2017-03-10 LAB — TSH: TSH: 0.633 u[IU]/mL (ref 0.350–4.500)

## 2017-03-10 LAB — TROPONIN I

## 2017-03-10 LAB — HEMOGLOBIN A1C
HEMOGLOBIN A1C: 7.3 % — AB (ref 4.8–5.6)
Mean Plasma Glucose: 162.81 mg/dL

## 2017-03-10 LAB — GLUCOSE, CAPILLARY
GLUCOSE-CAPILLARY: 96 mg/dL (ref 65–99)
GLUCOSE-CAPILLARY: 115 mg/dL — AB (ref 65–99)
Glucose-Capillary: 122 mg/dL — ABNORMAL HIGH (ref 65–99)

## 2017-03-10 MED ORDER — METOPROLOL TARTRATE 25 MG PO TABS
25.0000 mg | ORAL_TABLET | Freq: Once | ORAL | Status: AC
  Administered 2017-03-10: 25 mg via ORAL
  Filled 2017-03-10: qty 1

## 2017-03-10 MED ORDER — LISINOPRIL 10 MG PO TABS: 10.0000 mg | ORAL_TABLET | Freq: Every day | ORAL | 0 refills | Status: DC

## 2017-03-10 MED ORDER — METOPROLOL TARTRATE 50 MG PO TABS: 50.0000 mg | ORAL_TABLET | Freq: Two times a day (BID) | ORAL | Status: DC

## 2017-03-10 MED ORDER — REGADENOSON 0.4 MG/5ML IV SOLN
0.4000 mg | Freq: Once | INTRAVENOUS | Status: AC
  Administered 2017-03-10: 0.4 mg via INTRAVENOUS
  Filled 2017-03-10: qty 5

## 2017-03-10 MED ORDER — TECHNETIUM TC 99M TETROFOSMIN IV KIT
30.0000 | PACK | Freq: Once | INTRAVENOUS | Status: AC | PRN
  Administered 2017-03-10: 30 via INTRAVENOUS

## 2017-03-10 MED ORDER — ASPIRIN EC 325 MG PO TBEC: 325.0000 mg | DELAYED_RELEASE_TABLET | Freq: Every day | ORAL | 0 refills | Status: AC

## 2017-03-10 MED ORDER — REGADENOSON 0.4 MG/5ML IV SOLN
INTRAVENOUS | Status: AC
  Administered 2017-03-10: 0.4 mg via INTRAVENOUS
  Filled 2017-03-10: qty 5

## 2017-03-10 MED ORDER — METOPROLOL TARTRATE 50 MG PO TABS: 50.0000 mg | ORAL_TABLET | Freq: Two times a day (BID) | ORAL | 0 refills | Status: DC

## 2017-03-10 MED ORDER — PANTOPRAZOLE SODIUM 40 MG PO TBEC: 40.0000 mg | DELAYED_RELEASE_TABLET | Freq: Every day | ORAL | 0 refills | Status: DC | PRN

## 2017-03-10 MED ORDER — TECHNETIUM TC 99M TETROFOSMIN IV KIT
10.0000 | PACK | Freq: Once | INTRAVENOUS | Status: AC | PRN
  Administered 2017-03-10: 10 via INTRAVENOUS

## 2017-03-10 NOTE — Progress Notes (Signed)
   Raven Weaver presented for a nuclear stress test today.  No immediate complications.  Stress imaging is pending at this time.  Preliminary EKG findings may be listed in the chart, but the stress test result will not be finalized until perfusion imaging is complete.  Tami Lin Nadra Hritz, PA-C 03/10/2017, 12:44 PM

## 2017-03-10 NOTE — Progress Notes (Signed)
PROGRESS NOTE    Raven Weaver  XFG:[id number] DOB: 03-Jan-1961 DOA: 03/09/2017 PCP: [name], DO   Chief Complaint  Patient presents with  . Chest Pain    Brief Narrative:  HPI on 03/10/2015 by Dr. Ivor Costa Raven Weaver is a 57 y.o. female with medical history significant of hypertension, diabetes mellitus, GERD, SVT, PVC, former smoker, who presents with chest pressure.  Patient states that her chest pressure started since last night. It is located in the substernal area, intermittent, 5 out of 10 in severity, radiating to the left arm. She has had more than 10 episodes since started, each time it lasted for about a few minutes, then resolves spontaneously. It is associated with palpitation, but no SOB. Patient denies fever, chills, cough. No tenderness in the calf areas. No recent long distant traveling. Patient denies nausea, vomiting, diarrhea, abdominal pain, symptoms of UTI or unilateral weakness.  Interim history Patient admitted for chest pain. Cardiology consultation appreciated echocardiogram pending. 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  Consultants Cardiology  Procedures  Echocardiogram Lexiscan  Antibiotics   Anti-infectives (From admission, onward)   None      Subjective:   Raven Weaver seen and examined today. Denies further chest pressure. Denies any shortness of breath, bowel pain, nausea vomiting, diarrhea constipation. With left to go home.  Objective:   Vitals:   03/10/17 1244 03/10/17 1245 03/10/17 1247 03/10/17 1358  BP: 136/77 (!) 146/78 127/71 (!) 156/70  Pulse: 95 94 81 79  Resp:      Temp:      TempSrc:      SpO2:      Weight:      Height:        Intake/Output Summary (Last 24 hours) at 03/10/2017 1411 Last data filed at 03/10/2017 1100 Gross per 24 hour  Intake 1236.25 ml  Output -  Net 1236.25 ml   Filed Weights   03/09/17 1625 03/09/17 2030 03/10/17 0500  Weight: 83.9 kg (185 lb) 85.6 kg (188 lb 11.2 oz) 84.2 kg (185 lb 10 oz)    Exam  General: Well developed, well nourished, NAD, appears stated age  HEENT: NCAT,  mucous membranes moist.   Cardiovascular: S1 S2 auscultated, 2/6SEM, RRR  Respiratory: Clear to auscultation bilaterally with equal chest rise  Abdomen: Soft, nontender, nondistended, + bowel sounds  Extremities: warm dry without cyanosis clubbing or edema  Neuro: AAOx3, nonfocal  Psych: Normal affect and demeanor with intact judgement and insight   Data Reviewed: I have personally reviewed following labs and imaging studies  CBC: Recent Labs  Lab 03/09/17 1633  WBC 10.1  HGB 14.0  HCT 41.1  MCV 90.5  PLT 938   Basic Metabolic Panel: Recent Labs  Lab 03/09/17 1633  NA 139  K 3.9  CL 106  CO2 21*  GLUCOSE 135*  BUN 8  CREATININE 0.76  CALCIUM 9.7   GFR: Estimated Creatinine Clearance: 80.5 mL/min (by C-G formula based on SCr of 0.76 mg/dL). Liver Function Tests: No results for input(s): AST, ALT, ALKPHOS, BILITOT, PROT, ALBUMIN in  the last 168 hours. No results for input(s): LIPASE, AMYLASE in the last 168 hours. No results for input(s): AMMONIA in the last 168 hours. Coagulation Profile: No results for input(s): INR, PROTIME in the last 168 hours. Cardiac Enzymes: Recent Labs  Lab 03/09/17 1919 03/10/17 0117 03/10/17 0700  TROPONINI <0.03 <0.03 <0.03   BNP (last 3 results) No results for input(s): PROBNP in the last 8760 hours. HbA1C: Recent Labs    03/10/17 0203  HGBA1C 7.3*   CBG: Recent Labs  Lab 03/09/17 2105 03/10/17 0757 03/10/17 1055  GLUCAP 96 122* 115*   Lipid Profile: Recent Labs    03/10/17 0203  CHOL 140  HDL 39*  LDLCALC 81  TRIG 101  CHOLHDL 3.6   Thyroid Function Tests: No results for input(s): TSH, T4TOTAL, FREET4, T3FREE, THYROIDAB in the last 72 hours. Anemia Panel: No results for input(s): VITAMINB12, FOLATE, FERRITIN, TIBC, IRON, RETICCTPCT in the last 72 hours. Urine analysis: No results found for: COLORURINE, APPEARANCEUR, LABSPEC, PHURINE, GLUCOSEU, HGBUR, BILIRUBINUR, KETONESUR, PROTEINUR, UROBILINOGEN, NITRITE, LEUKOCYTESUR Sepsis Labs: @LABRCNTIP (procalcitonin:4,lacticidven:4)  )No results found for this or any previous visit (from the past 240 hour(s)).    Radiology Studies: Dg Chest 2 View  Result Date: 03/09/2017 CLINICAL DATA:  Chest pain and cardiac palpitations EXAM: CHEST  2 VIEW COMPARISON:  None. FINDINGS: Lungs are clear. Heart size and pulmonary vascularity are normal. No adenopathy. No pneumothorax. No bone lesions. IMPRESSION: No edema or consolidation. Electronically Signed   By: Lowella Grip III M.D.   On: 03/09/2017 16:50     Scheduled Meds: . aspirin  324 mg Oral Daily  . enoxaparin (LOVENOX) injection  40 mg Subcutaneous Q24H  . insulin aspart  0-5 Units Subcutaneous QHS  . insulin aspart  0-9 Units Subcutaneous TID WC  . lisinopril  10 mg Oral Daily  . metoprolol tartrate  50 mg Oral BID   Continuous Infusions: . sodium  chloride 75 mL/hr at 03/10/17 1358     LOS: 0 days   Time Spent in minutes   30 minutes  Raven Weaver D.O. on 03/10/2017 at 2:11 PM  Between 7am to 7pm - Pager - [phone]  After 7pm go to [email or web address] - password [email or web address]  And look for the night coverage person covering for me after hours  [facility] Office  [phone]

## 2017-03-10 NOTE — Discharge Summary (Signed)
Physician Discharge Summary  Raven Weaver [id number]:[id number] DOB: 06-08-1960 DOA: 03/09/2017  PCP: Raven Kern, DO  Admit date: 03/09/2017 Discharge date: 03/10/2017  Time spent: 45 minutes  Recommendations for Outpatient Follow-up:  Patient will be discharged to home.  Patient will need to follow up with primary care provider within one week of discharge, discuss diabetes management.  Follow up with cardiology, Dr. [name].  Patient should continue medications as prescribed.  Patient should follow a heart healthy/carb modified diet.   Discharge Diagnoses:  Chest pressure GERD PVC Essential hypertension Diabetes mellitus, type II  Discharge Condition: Stable  Diet recommendation: heart healthy/carb modified  Filed Weights   03/09/17 1625 03/09/17 2030 03/10/17 0500  Weight: 83.9 kg (185 lb) 85.6 kg (188 lb 11.2 oz) 84.2 kg (185 lb 10 oz)    History of present illness:  on 03/10/2015 by Dr. [name] a 57 y.o.femalewith medical history significant ofhypertension, diabetes mellitus, GERD, SVT, PVC,former smoker,who presents with chest pressure.  Patient states that her chest pressure started since last night.It is located in the substernal area, intermittent, 5 out of 10 in severity, radiating to the left arm. She has had more than 10 episodessince started, each time itlasted for about a few minutes, thenresolvesspontaneously. It is associated with palpitation, but noSOB. Patient denies fever, chills, cough. No tenderness in the calf areas. No recent long distant traveling. Patient denies nausea, vomiting, diarrhea, abdominal pain, symptoms of UTI or unilateral weakness.  Hospital Course:  Chest pressure -Patient with risk factors including a pretension, diabetes, former tobacco use -Troponin cycled and negative thus far -Echocardiogram EF 7169%, grade 2 diastolic dysfunction -Cardiology consultation appreciated, pending  recommendations -Status post Lexiscan Myoview: Decreased radiotracer uptake throughout the endocardial resulting in the appearance of transient ischemic dilatation. Ratio of 1.36. Left ventricular dilatation with mild global hypokinesis. EF 44%. Noninvasive prescribed medication high. -Metoprolol increased to 50 mg twice a day -Discussed Lexiscan results with Dr. [name]. Recommended outpatient cardiac CT which she will arrange for patient. Feels Lexiscan results may not be fully accurate.  GERD -Continue PPI  PVC -Noted on telemetry monitoring, chronic issue -As above, metoprolol increased  Essential hypertension -Continue lisinopril, metoprolol  Diabetes mellitus, type II -Metformin held- restart on discharge -Hemoglobin A1c 7.3 -Patient will need to follow-up with PCP to discuss metformin dosing, and likely increase her dose  Consultants Cardiology  Procedures  Echocardiogram Lexiscan  Discharge Exam: Vitals:   03/10/17 1247 03/10/17 1358  BP: 127/71 (!) 156/70  Pulse: 81 79  Resp:    Temp:  97.6 F (36.4 C)  SpO2:  100%   Denies further chest pressure. Denies shortness of breath, abdominal pain, nausea vomiting, diarrhea or constipation.   General: Well developed, well nourished, NAD, appears stated age  57: NCAT, mucous membranes moist.  Cardiovascular: S1 S2 auscultated, 2/6 SEM, RRR  Respiratory: Clear to auscultation bilaterally with equal chest rise  Abdomen: Soft, nontender, nondistended, + bowel sounds  Extremities: warm dry without cyanosis clubbing or edema  Neuro: AAOx3, nonfocal  Psych: Normal affect and demeanor with intact judgement and insight  Discharge Instructions Discharge Instructions    Discharge instructions   Complete by:  As directed    Patient will be discharged to home.  Patient will need to follow up with primary care provider within one week of discharge, discuss diabetes management.  Follow up with cardiology, Dr.  [name].  Patient should continue medications as prescribed.  Patient should follow a heart healthy/carb  modified diet.     Allergies as of 03/10/2017   No Known Allergies     Medication List    TAKE these medications   aspirin EC 325 MG tablet Take 1 tablet (325 mg total) by mouth daily.   lisinopril 10 MG tablet Commonly known as:  PRINIVIL,ZESTRIL Take 1 tablet (10 mg total) by mouth daily. Start taking on:  03/11/2017 What changed:  See the new instructions.   metFORMIN 500 MG tablet Commonly known as:  GLUCOPHAGE TAKE 1 TABLET BY MOUTH TWICE A DAY WITH FOOD   metoprolol tartrate 50 MG tablet Commonly known as:  LOPRESSOR Take 1 tablet (50 mg total) by mouth 2 (two) times daily. What changed:    medication strength  how much to take  Another medication with the same name was removed. Continue taking this medication, and follow the directions you see here.   NITROSTAT 0.4 MG SL tablet Generic drug:  nitroGLYCERIN PLACE 1 TABLET (0.4 MG TOTAL) UNDER THE TONGUE EVERY 5 (FIVE) MINUTES AS NEEDED FOR CHEST PAIN.   pantoprazole 40 MG tablet Commonly known as:  PROTONIX Take 1 tablet (40 mg total) by mouth daily as needed.      No Known Allergies Follow-up Information    Raven Kern, DO. Schedule an appointment as soon as possible for a visit in 1 week(s).   Specialty:  Family Medicine Why:  Hospital follow up Contact information: [place] [address] [phone]        Skeet Latch, MD. Schedule an appointment as soon as possible for a visit in 1 week(s).   Specialty:  Cardiology Why:  Hospital follow up Contact information: [address] [place] [address] [phone]            The results of significant diagnostics from this hospitalization (including imaging, microbiology, ancillary and laboratory) are listed below for reference.    Significant Diagnostic Studies: Dg Chest 2 View  Result Date:  03/09/2017 CLINICAL DATA:  Chest pain and cardiac palpitations EXAM: CHEST  2 VIEW COMPARISON:  None. FINDINGS: Lungs are clear. Heart size and pulmonary vascularity are normal. No adenopathy. No pneumothorax. No bone lesions. IMPRESSION: No edema or consolidation. Electronically Signed   By: Lowella Grip III M.D.   On: 03/09/2017 16:50   Nm Myocar Multi W/spect W/wall Motion / Ef  Result Date: 03/10/2017 CLINICAL DATA:  57 year old female with chest pain EXAM: MYOCARDIAL IMAGING WITH SPECT (REST AND PHARMACOLOGIC-STRESS) GATED LEFT VENTRICULAR WALL MOTION STUDY LEFT VENTRICULAR EJECTION FRACTION TECHNIQUE: Standard myocardial SPECT imaging was performed after resting intravenous injection of 10 mCi Tc-55m tetrofosmin. Subsequently, intravenous infusion of Lexiscan was performed under the supervision of the Cardiology staff. At peak effect of the drug, 30 mCi Tc-32m tetrofosmin was injected intravenously and standard myocardial SPECT imaging was performed. Quantitative gated imaging was also performed to evaluate left ventricular wall motion, and estimate left ventricular ejection fraction. COMPARISON:  Chest x-ray 03/09/2017; prior nuclear medicine cardiac stress test 01/15/2015 FINDINGS: Perfusion: No decreased activity in the left ventricle on stress imaging to suggest reversible ischemia or infarction. Wall Motion: Normal left ventricular wall motion. No left ventricular dilation. Left Ventricular Ejection Fraction: 70% End diastolic volume 350 ml End systolic volume 75 ml IMPRESSION: 1. Decreased radiotracer uptake throughout the endocardial resulting in appearance of transient ischemic dilatation. The TID ratio is 1.36. 2. Left ventricular dilatation with mild global hypokinesis. 3. Left ventricular ejection fraction 44% 4. Non invasive risk stratification*:  High *2012 Appropriate Use Criteria for Coronary Revascularization Focused Update: J Am Coll Cardiol. 1829;93(7):169-678.  http://[email or web address].[email or web address] Electronically Signed   By: Jacqulynn Cadet M.D.   On: 03/10/2017 16:04    Microbiology: No results found for this or any previous visit (from the past 240 hour(s)).   Labs: Basic Metabolic Panel: Recent Labs  Lab 03/09/17 1633  NA 139  K 3.9  CL 106  CO2 21*  GLUCOSE 135*  BUN 8  CREATININE 0.76  CALCIUM 9.7   Liver Function Tests: No results for input(s): AST, ALT, ALKPHOS, BILITOT, PROT, ALBUMIN in the last 168 hours. No results for input(s): LIPASE, AMYLASE in the last 168 hours. No results for input(s): AMMONIA in the last 168 hours. CBC: Recent Labs  Lab 03/09/17 1633  WBC 10.1  HGB 14.0  HCT 41.1  MCV 90.5  PLT 372   Cardiac Enzymes: Recent Labs  Lab 03/09/17 1919 03/10/17 0117 03/10/17 0700  TROPONINI <0.03 <0.03 <0.03   BNP: BNP (last 3 results) No results for input(s): BNP in the last 8760 hours.  ProBNP (last 3 results) No results for input(s): PROBNP in the last 8760 hours.  CBG: Recent Labs  Lab 03/09/17 2105 03/10/17 0757 03/10/17 1055  GLUCAP 96 122* 115*       Signed:  Nena Hampe  [facility] 03/10/2017, 4:47 PM

## 2017-03-10 NOTE — Progress Notes (Signed)
Echocardiogram 2D Echocardiogram has been performed.  Matilde Bash 03/10/2017, 11:40 AM

## 2017-03-10 NOTE — Discharge Instructions (Signed)

## 2017-03-10 NOTE — Progress Notes (Signed)
Patient received discharge information and acknowledged understanding of it. Patient IV was removed.  

## 2017-03-10 NOTE — Consult Note (Signed)
Cardiology Consultation:   Patient ID: DEEPTI GUNAWAN; [id number]; August 12, 1960   Admit date: 03/09/2017 Date of Consult: 03/10/2017  Primary Care Provider: [name], DO Primary Cardiologist: Dr [name]   Patient Profile:   Raven Weaver is a 57 y.o. female with a hx of HTN, DM, GERD, SVT, frequent PVC's, and former tobacco use who is being seen today for the evaluation of chest pain at the request of Dr Ree Kida.  History of Present Illness:   Raven Weaver follows with Dr. [name] and was last seen in clinic 9/7/018 for follow up of PVC's. She was originally referred to cardiology for frequent PVC's prior to a colonoscopy that was canceled. She had an exercise myoview in 2016 that was negative for ischemia. Echo in 2017 showed EF 55-60% and mild AR. 24-hour holter monitor in 2017 showed 18% monomorphic PVC's. She was then started on metop with request to repeat 24-hour holter but she declined. At her last appointment in September, she was feeling well with no CP, SOB, lightheadedness, or dizziness. She had occasional palpitations. Her metoprolol was increased to 50 mg BID.  Raven Weaver came to the ED on 03/09/2017 with intermittent substernal chest pressure and SOB that started on the evening of 1/21. The pain lasted a few minutes and then resolved with SL nitro. It would then come back after 2 hours. The pressure kept her from sleeping. It is not associated with activity or food. She has palpitations, but no nausea, vomiting, or diaphoresis. No fever, chills, or cough. No edema. Good appetite. She had an episode of dizziness early January while in bed, but no dizziness or lightheadedness since.   She is compliant with her medications and has not missed any doses. She does not recall a change in her metoprolol in September and is unsure of the dose she takes, but believes it is 25 mg BID. She is a former smoker (quit 2017), no ETOH, or drugs.   She has not had further CP or SOB  since yesterday. States that she can feel her PVC's, but does not have dizziness or lightheadedness.  Pertinent admission labs include: Na 139, K 3.9, creatinine 0.76, troponin negative x3, WBC 10.1, hemoglobin 14.0, CXR with no edema or consolidation  Past Medical History:  Diagnosis Date  . Abnormal Pap smear of cervix    remote, > 25 years ago, s/p cone bxs, all normal since per her report  . Diabetes mellitus without complication ([place])   . Essential hypertension 04/08/2015  . GERD (gastroesophageal reflux disease)   . History of kidney stones   . Hyperglycemia    gestational diabetes  . Kidney stones    remote, > 30 years ago  . PVC (premature ventricular contraction) 12/26/2014  . SVT (supraventricular tachycardia) ([place]) 12/26/2014  . Tobacco use     Past Surgical History:  Procedure Laterality Date  . ABLATION     uterine  . CESAREAN SECTION       Home Medications:  Prior to Admission medications   Medication Sig Start Date End Date Taking? Authorizing Provider  lisinopril (PRINIVIL,ZESTRIL) 10 MG tablet TAKE 1 TABLET BY MOUTH EVERY DAY **NEEDS APPOINTMENT** 02/17/17  Yes Colin Benton R, DO  metFORMIN (GLUCOPHAGE) 500 MG tablet TAKE 1 TABLET BY MOUTH TWICE A DAY WITH FOOD 02/15/17  Yes Colin Benton R, DO  metoprolol tartrate (LOPRESSOR) 25 MG tablet TAKE 1 TABLET BY MOUTH TWICE A DAY 01/04/17  Yes Skeet Latch, MD  NITROSTAT 0.4 MG SL  tablet PLACE 1 TABLET (0.4 MG TOTAL) UNDER THE TONGUE EVERY 5 (FIVE) MINUTES AS NEEDED FOR CHEST PAIN. 06/08/16  Yes Skeet Latch, MD  metoprolol tartrate (LOPRESSOR) 50 MG tablet Take 1 tablet (50 mg total) by mouth 2 (two) times daily. Patient not taking: Reported on 03/09/2017 10/23/16   Skeet Latch, MD    Inpatient Medications: Scheduled Meds: . aspirin  324 mg Oral Daily  . enoxaparin (LOVENOX) injection  40 mg Subcutaneous Q24H  . insulin aspart  0-5 Units Subcutaneous QHS  . insulin aspart  0-9 Units Subcutaneous TID WC  .  lisinopril  10 mg Oral Daily  . metoprolol tartrate  25 mg Oral Once  . metoprolol tartrate  50 mg Oral BID   Continuous Infusions: . sodium chloride 75 mL/hr at 03/09/17 2143   PRN Meds: acetaminophen, ALPRAZolam, hydrALAZINE, morphine injection, nitroGLYCERIN, ondansetron (ZOFRAN) IV, pantoprazole, zolpidem  Allergies:   No Known Allergies  Social History:   Social History   Socioeconomic History  . Marital status: Married    Spouse name: Not on file  . Number of children: Not on file  . Years of education: Not on file  . Highest education level: Not on file  Social Needs  . Financial resource strain: Not on file  . Food insecurity - worry: Not on file  . Food insecurity - inability: Not on file  . Transportation needs - medical: Not on file  . Transportation needs - non-medical: Not on file  Occupational History  . Not on file  Tobacco Use  . Smoking status: Former Smoker    Packs/day: 0.25    Types: Cigarettes    Last attempt to quit: 12/19/2014    Years since quitting: 2.2  . Smokeless tobacco: Never Used  . Tobacco comment: per patient quit 8 weeks ago  Substance and Sexual Activity  . Alcohol use: No    Alcohol/week: 0.0 oz    Frequency: Never    Comment: 1 glass every 2 weeks  . Drug use: No  . Sexual activity: Not on file  Other Topics Concern  . Not on file  Social History Narrative   Work or School: full time for [employer] Situation: lives with mom and kids home during the summer      Spiritual Beliefs: Christian      Lifestyle: walks 2-3 days per week; diet is fair per her report - avoid fried foods - tries to eat veggies and lean meat       Family History:    Family History  Problem Relation Age of Onset  . Hypertension Mother   . Diabetes Mother   . Heart disease Mother 50       MI  . CAD Mother 31  . Cancer Maternal Aunt   . Diabetes Maternal Grandfather   . Hypertension Maternal Grandfather   . Cancer Paternal Grandmother   .  Colon cancer Neg Hx      ROS:  Please see the history of present illness.  ROS  All other ROS reviewed and negative.     Physical Exam/Data:   Vitals:   03/09/17 2000 03/09/17 2030 03/10/17 0500 03/10/17 0849  BP: (!) 143/60 117/63 (!) 137/51 132/64  Pulse: 81 83 78 84  Resp: 19 18 18    Temp:  98 F (36.7 C) 98.6 F (37 C)   TempSrc:  Oral Oral   SpO2: 100% 99% 98%   Weight:  188 lb 11.2  oz (85.6 kg) 185 lb 10 oz (84.2 kg)   Height:  5\' 4"  (1.626 m)      Intake/Output Summary (Last 24 hours) at 03/10/2017 1034 Last data filed at 03/10/2017 0504 Gross per 24 hour  Intake 791.25 ml  Output -  Net 791.25 ml   Filed Weights   03/09/17 1625 03/09/17 2030 03/10/17 0500  Weight: 185 lb (83.9 kg) 188 lb 11.2 oz (85.6 kg) 185 lb 10 oz (84.2 kg)   Body mass index is 31.86 kg/m.  General:  Well nourished, well developed, in no acute distress HEENT: normal Lymph: no adenopathy Neck: no JVD Endocrine:  No thryomegaly Vascular: No carotid bruits; FA pulses 2+ bilaterally without bruits  Cardiac:  normal S1, S2; RRR; 2/6 systolic murmur heard best at LUSB Lungs:  clear to auscultation bilaterally, no wheezing, rhonchi or rales  Abd: soft, nontender, no hepatomegaly  Ext: no edema Musculoskeletal:  No deformities, BUE and BLE strength normal and equal Skin: warm and dry  Neuro:  CNs 2-12 intact, no focal abnormalities noted Psych:  Normal affect   EKG:  The EKG was personally reviewed and demonstrates:  SR with frequent monomorphic PVC's, 78 bpm Telemetry:  Telemetry was personally reviewed and demonstrates: SR with frequent PVC's, bigeminy, and trigeminy. PVCs 25-32/min.  Relevant CV Studies: Exercise Myoview 01/15/15:   There was no ST segment deviation noted during stress.  This is a low risk study. Low risk stress nuclear study with a small, mild, fixed apical defect likely related to apical thinning; no ischemia; study not gated due to ventricular ectopy.  Echo  02/19/2015: - Left ventricle: The cavity size was normal. There was moderate   concentric hypertrophy. Systolic function was normal. The   estimated ejection fraction was in the range of 55% to 60%. Wall   motion was normal; there were no regional wall motion   abnormalities. - Aortic valve: There was mild regurgitation. - Left atrium: The atrium was mildly dilated. - Atrial septum: No defect or patent foramen ovale was identified.  Laboratory Data:  Chemistry Recent Labs  Lab 03/09/17 1633  NA 139  K 3.9  CL 106  CO2 21*  GLUCOSE 135*  BUN 8  CREATININE 0.76  CALCIUM 9.7  GFRNONAA >60  GFRAA >60  ANIONGAP 12    No results for input(s): PROT, ALBUMIN, AST, ALT, ALKPHOS, BILITOT in the last 168 hours. Hematology Recent Labs  Lab 03/09/17 1633  WBC 10.1  RBC 4.54  HGB 14.0  HCT 41.1  MCV 90.5  MCH 30.8  MCHC 34.1  RDW 12.7  PLT 372   Cardiac Enzymes Recent Labs  Lab 03/09/17 1919 03/10/17 0117 03/10/17 0700  TROPONINI <0.03 <0.03 <0.03    Recent Labs  Lab 03/09/17 1713  TROPIPOC 0.00    BNPNo results for input(s): BNP, PROBNP in the last 168 hours.  DDimer No results for input(s): DDIMER in the last 168 hours.  Radiology/Studies:  Dg Chest 2 View  Result Date: 03/09/2017 CLINICAL DATA:  Chest pain and cardiac palpitations EXAM: CHEST  2 VIEW COMPARISON:  None. FINDINGS: Lungs are clear. Heart size and pulmonary vascularity are normal. No adenopathy. No pneumothorax. No bone lesions. IMPRESSION: No edema or consolidation. Electronically Signed   By: Lowella Grip III M.D.   On: 03/09/2017 16:50    Assessment and Plan:   1. Chest pain - Troponin x 4 negative - EKG shows SR with frequent monomorphic PVC's. Looks unchanged from Sept 2018. - Home  medications include lopressor 25 mg BID and lisinopril 10 mg daily - Continue ASA 324 daily - ASCVD risk if 14.1%. May benefit from adding statin.  - Echo ordered. Not yet complete. - Risk factors include  HTN, DM, and former tobacco use. Myoview in 12/2014 showed low risk with no ischemia. Will discuss with attending repeating myoview. Keep NPO.  2. HTN  - SBP 130's - Continue metop and lisinopril  3. 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 PVC burden at that time was 18%.  - Will increase metop to 50 mg BID. - Keep K > 4.0 and mag > 2.0. Monitor daily BMP and mag.  4. DM - per primary  For questions or updates, please contact [place] Please consult [email or web address] for contact info under Cardiology/STEMI.   Signed, Georgiana Shore, NP  03/10/2017 10:34 AM

## 2017-03-11 ENCOUNTER — Telehealth: Payer: Self-pay | Admitting: *Deleted

## 2017-03-11 NOTE — Telephone Encounter (Signed)
Unable to reach patient at time of TCM Call.  No answer, no voicemail.  

## 2017-03-12 NOTE — Telephone Encounter (Signed)
Transition Care Management Follow-up Telephone Call  Per Discharge Summary: Admit date: 03/09/2017 Discharge date: 03/10/2017  Recommendations for Outpatient Follow-up:  Patient will be discharged to home.  Patient will need to follow up with primary care provider within one week of discharge, discuss diabetes management.  Follow up with cardiology, Dr. [name].  Patient should continue medications as prescribed.  Patient should follow a heart healthy/carb modified diet.   Discharge Diagnoses:  Chest pressure GERD PVC Essential hypertension Diabetes mellitus, type II  Discharge Condition: Stable  Diet recommendation: heart healthy/carb modified  --   How have you been since you were released from the hospital? "I'm good."   Do you understand why you were in the hospital? yes   Do you understand the discharge instructions? yes   Where were you discharged to? Home   Items Reviewed:  Medications reviewed: yes  Allergies reviewed: yes  Dietary changes reviewed: yes  Referrals reviewed: yes   Functional Questionnaire:   Activities of Daily Living (ADLs):   She states they are independent in the following: ambulation, bathing and hygiene, feeding, continence, grooming, toileting and dressing States they require assistance with the following: none   Any transportation issues/concerns?: no   Any patient concerns? no   Confirmed importance and date/time of follow-up visits scheduled no, patient declined office visit at this time because she is unemployed and uninsured and does not want to create another bill. Explained the importance of PCP follow-up after hospitalization, but patient feels that coming and creating another bill will "just stress me out more right now."  Confirmed with patient if condition begins to worsen call PCP or go to the ER.  Patient was given the office number and encouraged to call back with question or concerns.  : yes

## 2017-03-19 ENCOUNTER — Other Ambulatory Visit: Payer: Self-pay | Admitting: Family Medicine

## 2017-03-22 ENCOUNTER — Ambulatory Visit: Payer: Self-pay | Admitting: Cardiology

## 2017-04-06 ENCOUNTER — Telehealth: Payer: Self-pay | Admitting: *Deleted

## 2017-04-06 NOTE — Telephone Encounter (Signed)
Raven Weaver, Raven Weaver        Per call to patent to schedule test.  She can't afford to do this test. Patient refused.    Will forward to Dr [name] for review

## 2017-04-11 ENCOUNTER — Other Ambulatory Visit: Payer: Self-pay | Admitting: Family Medicine

## 2017-04-15 ENCOUNTER — Telehealth: Payer: Self-pay | Admitting: Family Medicine

## 2017-04-15 NOTE — Telephone Encounter (Signed)
Unable to leave a message due to no voicemail being set up. °

## 2017-04-15 NOTE — Telephone Encounter (Signed)
Copied from [place]. Topic: General - Other >> Apr 15, 2017  1:29 PM Darl Householder, RMA wrote: Reason for CRM: patient is requesting a call back from Dr. Maudie Mercury or CMA concerning medications, please return call

## 2017-04-22 ENCOUNTER — Telehealth: Payer: Self-pay | Admitting: Family Medicine

## 2017-04-22 NOTE — Telephone Encounter (Signed)
Copied from [place]. Topic: General - Other >> Apr 15, 2017  1:29 PM Darl Householder, RMA wrote: Reason for CRM: patient is requesting a call back from Dr. Maudie Mercury or CMA concerning medications, please return call   >> Apr 22, 2017  4:28 PM Darl Householder, RMA wrote: Patient is requesting a call back concerning medication, please return call

## 2017-04-23 MED ORDER — METFORMIN HCL 500 MG PO TABS: ORAL_TABLET | ORAL | 0 refills | Status: DC

## 2017-04-23 MED ORDER — LISINOPRIL 10 MG PO TABS: 10.0000 mg | ORAL_TABLET | Freq: Every day | ORAL | 0 refills | Status: DC

## 2017-04-23 NOTE — Telephone Encounter (Signed)
OK thank you 

## 2017-04-23 NOTE — Telephone Encounter (Signed)
See prior note

## 2017-04-23 NOTE — Telephone Encounter (Signed)
I called the pt and informed her she needs to be seen at least within a year or a shorter time period for diabetes in order to have refills sent.  She stated she does not have any insurance or money to come in for an appt.  I offered to have her speak with someone to help with financial assistance and she stated she cannot do this as she does not have any money.  I gave the pt phone numbers for [facility] to call for an appt.  Per the pts request a 30-day supply of Lisinopril and Metformin was sent to the pts pharmacy.

## 2017-04-27 ENCOUNTER — Ambulatory Visit: Payer: Self-pay | Admitting: Family Medicine

## 2017-04-28 ENCOUNTER — Ambulatory Visit: Payer: Self-pay | Admitting: Cardiovascular Disease

## 2017-05-18 ENCOUNTER — Ambulatory Visit: Payer: Self-pay | Admitting: Family Medicine

## 2017-05-20 ENCOUNTER — Encounter: Payer: Self-pay | Admitting: Family Medicine

## 2017-05-20 ENCOUNTER — Ambulatory Visit (INDEPENDENT_AMBULATORY_CARE_PROVIDER_SITE_OTHER): Payer: Self-pay | Admitting: Family Medicine

## 2017-05-20 VITALS — BP 120/68 | HR 81 | Temp 98.3°F | Ht 64.0 in | Wt 190.1 lb

## 2017-05-20 DIAGNOSIS — K219 Gastro-esophageal reflux disease without esophagitis: Secondary | ICD-10-CM

## 2017-05-20 DIAGNOSIS — Z6832 Body mass index (BMI) 32.0-32.9, adult: Secondary | ICD-10-CM

## 2017-05-20 DIAGNOSIS — I10 Essential (primary) hypertension: Secondary | ICD-10-CM

## 2017-05-20 DIAGNOSIS — E119 Type 2 diabetes mellitus without complications: Secondary | ICD-10-CM

## 2017-05-20 LAB — CBC
HCT: 41.4 % (ref 36.0–46.0)
HEMOGLOBIN: 13.9 g/dL (ref 12.0–15.0)
MCHC: 33.5 g/dL (ref 30.0–36.0)
MCV: 89.4 fl (ref 78.0–100.0)
Platelets: 356 10*3/uL (ref 150.0–400.0)
RBC: 4.63 Mil/uL (ref 3.87–5.11)
RDW: 13.6 % (ref 11.5–15.5)
WBC: 10.4 10*3/uL (ref 4.0–10.5)

## 2017-05-20 LAB — BASIC METABOLIC PANEL
BUN: 13 mg/dL (ref 6–23)
CO2: 28 mEq/L (ref 19–32)
CREATININE: 0.86 mg/dL (ref 0.40–1.20)
Calcium: 9.9 mg/dL (ref 8.4–10.5)
Chloride: 103 mEq/L (ref 96–112)
GFR: 87.09 mL/min (ref >60.00)
Glucose, Bld: 161 mg/dL — ABNORMAL HIGH (ref 70–99)
POTASSIUM: 4.2 meq/L (ref 3.5–5.1)
Sodium: 137 mEq/L (ref 135–145)

## 2017-05-20 LAB — HEMOGLOBIN A1C: HEMOGLOBIN A1C: 7.4 % — AB (ref 4.6–6.5)

## 2017-05-20 MED ORDER — METOPROLOL TARTRATE 50 MG PO TABS: 50.0000 mg | ORAL_TABLET | Freq: Two times a day (BID) | ORAL | 3 refills | Status: DC

## 2017-05-20 MED ORDER — LISINOPRIL 10 MG PO TABS: 10.0000 mg | ORAL_TABLET | Freq: Every day | ORAL | 3 refills | Status: DC

## 2017-05-20 MED ORDER — METFORMIN HCL 500 MG PO TABS
ORAL_TABLET | ORAL | 3 refills | Status: DC
Start: 2017-05-20 — End: 2017-05-20

## 2017-05-20 MED ORDER — METFORMIN HCL 500 MG PO TABS: ORAL_TABLET | ORAL | 3 refills | Status: DC

## 2017-05-20 NOTE — Patient Instructions (Addendum)
BEFORE YOU LEAVE: -Wendie Simmer, printed copies of all prescriptions - 90 days with 3 refills -financial help with Sheena - ?medications, copays, mammogram, colonosocpy -labs -follow up: 4 months  We have ordered labs or studies at this visit. It can take up to 1-2 weeks for results and processing. IF results require follow up or explanation, we will call you with instructions. Clinically stable results will be released to your [facility]. If you have not heard from [place] or cannot find your results in [facility] in 2 weeks please contact our office at [phone].  If you are not yet signed up for [facility], please consider signing up.  Try ranitidine over the counter for acid reflux   We recommend the following healthy lifestyle for LIFE: 1) Small portions. But, make sure to get regular (at least 3 per day), healthy meals and small healthy snacks if needed.  2) Eat a healthy clean diet.   TRY TO EAT: -at least 5-7 servings of low sugar, colorful, and nutrient rich vegetables per day (not corn, potatoes or bananas.) -berries are the best choice if you wish to eat fruit (only eat small amounts if trying to reduce weight)  -lean meets (fish, white meat of chicken or [place]) -vegan proteins for some meals - beans or tofu, whole grains, nuts and seeds -Replace bad fats with good fats - good fats include: fish, nuts and seeds, canola oil, olive oil -small amounts of low fat or non fat dairy -small amounts of100 % whole grains - check the lables -drink plenty of water  AVOID: -SUGAR, sweets, anything with added sugar, corn syrup or sweeteners - must read labels as even foods advertised as "healthy" often are loaded with sugar -if you must have a sweetener, small amounts of stevia may be best -sweetened beverages and artificially sweetened beverages -simple starches (rice, bread, potatoes, pasta, chips, etc - small amounts of 100% whole grains are ok) -red meat, pork, butter -fried foods, fast food,  processed food, excessive dairy, eggs and coconut.  3)Get at least 150 minutes of sweaty aerobic exercise per week.

## 2017-05-20 NOTE — Progress Notes (Signed)
HPI:  Using dictation device. Unfortunately this device frequently misinterprets words/phrases.  Raven Weaver is a 57 year old with a past medical history significant for diabetes, hypertension, acid reflux and PVCs here for follow-up.  She has not been in to see me in a quite a long time.  She unfortunately no longer has insurance, so has difficulty affording co-pays.  She agrees to do lab work today.  She is in need of refills, reports she currently is not out of any of her medications.  Reports she gets some exercise.  Denies chest pain, shortness of breath, polyuria, vision changes. Advised of health maintenance measures due, she currently does not have the money to do these.  She is agreeable to meeting with my office staff to discuss resources to assist.  ROS: See pertinent positives and negatives per HPI.  Past Medical History:  Diagnosis Date  . Abnormal Pap smear of cervix    remote, > 25 years ago, s/p cone bxs, all normal since per her report  . Diabetes mellitus without complication ([place])   . Essential hypertension 04/08/2015  . GERD (gastroesophageal reflux disease)   . History of kidney stones   . Hyperglycemia    gestational diabetes  . Kidney stones    remote, > 30 years ago  . PVC (premature ventricular contraction) 12/26/2014  . SVT (supraventricular tachycardia) ([place]) 12/26/2014  . Tobacco use     Past Surgical History:  Procedure Laterality Date  . ABLATION     uterine  . CESAREAN SECTION      Family History  Problem Relation Age of Onset  . Hypertension Mother   . Diabetes Mother   . Heart disease Mother 17       MI  . CAD Mother 12  . Cancer Maternal Aunt   . Diabetes Maternal Grandfather   . Hypertension Maternal Grandfather   . Cancer Paternal Grandmother   . Colon cancer Neg Hx     SOCIAL HX: No reported changes   Current Outpatient Medications:  .  aspirin EC 325 MG tablet, Take 1 tablet (325 mg total) by mouth daily., Disp: 30  tablet, Rfl: 0 .  lisinopril (PRINIVIL,ZESTRIL) 10 MG tablet, Take 1 tablet (10 mg total) by mouth daily., Disp: 90 tablet, Rfl: 3 .  metFORMIN (GLUCOPHAGE) 500 MG tablet, TAKE 1 TABLET BY MOUTH TWICE A DAY WITH FOOD.NEED APPT FOR MORE REFILLS, Disp: 180 tablet, Rfl: 3 .  metoprolol tartrate (LOPRESSOR) 50 MG tablet, Take 1 tablet (50 mg total) by mouth 2 (two) times daily., Disp: 180 tablet, Rfl: 3 .  NITROSTAT 0.4 MG SL tablet, PLACE 1 TABLET (0.4 MG TOTAL) UNDER THE TONGUE EVERY 5 (FIVE) MINUTES AS NEEDED FOR CHEST PAIN., Disp: 25 tablet, Rfl: 2 .  pantoprazole (PROTONIX) 40 MG tablet, Take 1 tablet (40 mg total) by mouth daily as needed., Disp: 30 tablet, Rfl: 0  EXAM:  Vitals:   05/20/17 1314  BP: 120/68  Pulse: 81  Temp: 98.3 F (36.8 C)    Body mass index is 32.63 kg/m.  GENERAL: vitals reviewed and listed above, alert, oriented, appears well hydrated and in no acute distress  HEENT: atraumatic, conjunttiva clear, no obvious abnormalities on inspection of external nose and ears  NECK: no obvious masses on inspection  LUNGS: clear to auscultation bilaterally, no wheezes, rales or rhonchi, good air movement  CV: HRRR, no peripheral edema  MS: moves all extremities without noticeable abnormality  PSYCH: pleasant and cooperative, no obvious depression or  anxiety  ASSESSMENT AND PLAN:  Discussed the following assessment and plan:  Type 2 diabetes mellitus without complication, without long-term current use of insulin ([place]) - Plan: Basic metabolic panel, CBC, Hemoglobin A1c  Essential hypertension - Plan: Basic metabolic panel, CBC, Hemoglobin A1c  Gastroesophageal reflux disease without esophagitis  BMI 32.0-32.9,adult  -Labs per orders -Reviewed health maintenance that is due, she declined at this time due to cost concerns, I will have Sheena Cox from meet with her after I see her to give her information regarding financial resources health care, prescriptions,  preventive care, etc. -Printed all prescriptions, so that she can check at various pharmacies for the best price -Lifestyle recommendations -Advised follow-up in about 4 months   Patient Instructions  BEFORE YOU LEAVE: -Wendie Simmer, printed copies of all prescriptions - 90 days with 3 refills -financial help with Sheena - ?medications, copays, mammogram, colonosocpy -labs -follow up: 4 months  We have ordered labs or studies at this visit. It can take up to 1-2 weeks for results and processing. IF results require follow up or explanation, we will call you with instructions. Clinically stable results will be released to your [facility]. If you have not heard from [place] or cannot find your results in [facility] in 2 weeks please contact our office at [phone].  If you are not yet signed up for [facility], please consider signing up.  Try ranitidine over the counter for acid reflux   We recommend the following healthy lifestyle for LIFE: 1) Small portions. But, make sure to get regular (at least 3 per day), healthy meals and small healthy snacks if needed.  2) Eat a healthy clean diet.   TRY TO EAT: -at least 5-7 servings of low sugar, colorful, and nutrient rich vegetables per day (not corn, potatoes or bananas.) -berries are the best choice if you wish to eat fruit (only eat small amounts if trying to reduce weight)  -lean meets (fish, white meat of chicken or [place]) -vegan proteins for some meals - beans or tofu, whole grains, nuts and seeds -Replace bad fats with good fats - good fats include: fish, nuts and seeds, canola oil, olive oil -small amounts of low fat or non fat dairy -small amounts of100 % whole grains - check the lables -drink plenty of water  AVOID: -SUGAR, sweets, anything with added sugar, corn syrup or sweeteners - must read labels as even foods advertised as "healthy" often are loaded with sugar -if you must have a sweetener, small amounts of stevia may be best -sweetened  beverages and artificially sweetened beverages -simple starches (rice, bread, potatoes, pasta, chips, etc - small amounts of 100% whole grains are ok) -red meat, pork, butter -fried foods, fast food, processed food, excessive dairy, eggs and coconut.  3)Get at least 150 minutes of sweaty aerobic exercise per week.             [name], DO

## 2017-05-24 NOTE — Addendum Note (Signed)
Addended by: Agnes Lawrence on: 05/24/2017 02:45 PM   Modules accepted: Orders

## 2018-05-09 ENCOUNTER — Other Ambulatory Visit: Payer: Self-pay

## 2018-05-09 ENCOUNTER — Ambulatory Visit (INDEPENDENT_AMBULATORY_CARE_PROVIDER_SITE_OTHER): Payer: Self-pay | Admitting: Family Medicine

## 2018-05-09 DIAGNOSIS — I10 Essential (primary) hypertension: Secondary | ICD-10-CM

## 2018-05-09 DIAGNOSIS — E119 Type 2 diabetes mellitus without complications: Secondary | ICD-10-CM

## 2018-05-09 LAB — HEMOGLOBIN A1C: Hgb A1c MFr Bld: 7.3 % — ABNORMAL HIGH (ref 4.6–6.5)

## 2018-05-09 LAB — BASIC METABOLIC PANEL
BUN: 13 mg/dL (ref 6–23)
CALCIUM: 9.8 mg/dL (ref 8.4–10.5)
CO2: 29 meq/L (ref 19–32)
Chloride: 100 mEq/L (ref 96–112)
Creatinine, Ser: 0.82 mg/dL (ref 0.40–1.20)
GFR: 86.28 mL/min (ref >60.00)
GLUCOSE: 107 mg/dL — AB (ref 70–99)
Potassium: 4.4 mEq/L (ref 3.5–5.1)
SODIUM: 136 meq/L (ref 135–145)

## 2018-05-09 MED ORDER — METOPROLOL TARTRATE 50 MG PO TABS: 50.0000 mg | ORAL_TABLET | Freq: Two times a day (BID) | ORAL | 3 refills | Status: DC

## 2018-05-09 MED ORDER — METFORMIN HCL 1000 MG PO TABS: 1000.0000 mg | ORAL_TABLET | Freq: Two times a day (BID) | ORAL | 3 refills | Status: DC

## 2018-05-09 MED ORDER — LISINOPRIL 10 MG PO TABS: 10.0000 mg | ORAL_TABLET | Freq: Every day | ORAL | 3 refills | Status: DC

## 2018-05-09 NOTE — Progress Notes (Signed)
Virtual Visit via Telephone Note  I connected with Raven Weaver on 05/09/18 at 11:00 AM EDT by telephone and verified that I am speaking with the correct person using two identifiers.   Limitations, risks, security and privacy concerns of performing an evaluation and management service by telephone and the availability of in person appointments discuss with pt when scheduled. Pt also made aware there may be a charge.The patient expressed understanding and agreed to proceed.  History of Present Illness:   HTN: -taking lisinopril, met twice -checks blood pressure at home and has been good -no sob, cp, palpitations, DOE, swelling - no symptoms with exercise  Diabetes/Obesity: -taking metformin twice daily -no glucose monitor - does not check her blood sugar -no low sugars or issues with taking this medication -checks weight at home and feels is stable.  Past due on a number of HM measures. Advised colonscopy and due - she declined for now. Agrees to let us know if changes her mind.  Location patient: car Location provider: work or home office Participants present for the call: patient, provider Patient did not have a visit in the prior 7 days to address this/these issue(s).      Observations/Objective: Patient sounds cheerful and well on the phone. I do not appreciate any SOB. Speech and thought processing are grossly intact. Patient reported vitals: report BP good on home checks and weight stable  Assessment and Plan: 1. Essential hypertension - Basic metabolic panel -medications refilled  2. Type 2 diabetes mellitus without complication, without long-term current use of insulin (HCC) -medication refilled -lifestyle recs - Hemoglobin A1c  Recommend HM due and referral to GI for colonoscopy. She declined/refused at this time. Advised that she let us know if changes her mind. Also advised I will be leaving clinical practice so advise NPV with Dr. Burton Apley in 3  months - virtual for now in light of COVID19 situation. Advised my assistant to contact this pt to schedule this appointment. I did not refer this patient for an OV in the next 24 hours for this/these issue(s).  I discussed the assessment and treatment plan with the patient. The patient was provided an opportunity to ask questions and all were answered. The patient agreed with the plan and demonstrated an understanding of the instructions.   The patient was advised to call back or seek an in-person evaluation if the symptoms worsen or if the condition fails to improve as anticipated.  I provided 15 minutes of non-face-to-face time during this encounter.   [name], DO

## 2018-05-28 ENCOUNTER — Other Ambulatory Visit: Payer: Self-pay | Admitting: Family Medicine

## 2018-05-30 ENCOUNTER — Other Ambulatory Visit: Payer: Self-pay | Admitting: *Deleted

## 2018-05-30 MED ORDER — LISINOPRIL 10 MG PO TABS: 10.0000 mg | ORAL_TABLET | Freq: Every day | ORAL | 3 refills | Status: DC

## 2018-05-30 NOTE — Telephone Encounter (Signed)
Rx done. 

## 2018-08-01 ENCOUNTER — Encounter: Payer: Self-pay | Admitting: Family Medicine

## 2018-08-09 ENCOUNTER — Other Ambulatory Visit: Payer: Self-pay | Admitting: Family Medicine

## 2018-09-02 ENCOUNTER — Telehealth: Payer: Self-pay | Admitting: Family Medicine

## 2018-09-02 MED ORDER — LISINOPRIL 10 MG PO TABS: 10.0000 mg | ORAL_TABLET | Freq: Every day | ORAL | 0 refills | Status: DC

## 2018-09-02 NOTE — Telephone Encounter (Signed)
Patient called saying that she is all out of her Lisinopril medication. She says that she is completely out of this medication. Patient is schedule for an TOC on Monday but she is wanting to know if she could get a temp refill called into the pharmacy that will hold her over the weekend. Please advise

## 2018-09-02 NOTE — Telephone Encounter (Signed)
Rx done. 

## 2018-09-05 ENCOUNTER — Encounter: Payer: Self-pay | Admitting: Family Medicine

## 2018-09-05 ENCOUNTER — Ambulatory Visit (INDEPENDENT_AMBULATORY_CARE_PROVIDER_SITE_OTHER): Payer: Self-pay | Admitting: Family Medicine

## 2018-09-05 ENCOUNTER — Other Ambulatory Visit: Payer: Self-pay

## 2018-09-05 DIAGNOSIS — I10 Essential (primary) hypertension: Secondary | ICD-10-CM

## 2018-09-05 DIAGNOSIS — E119 Type 2 diabetes mellitus without complications: Secondary | ICD-10-CM

## 2018-09-05 DIAGNOSIS — R232 Flushing: Secondary | ICD-10-CM

## 2018-09-05 DIAGNOSIS — J302 Other seasonal allergic rhinitis: Secondary | ICD-10-CM

## 2018-09-05 MED ORDER — METOPROLOL TARTRATE 50 MG PO TABS: 50.0000 mg | ORAL_TABLET | Freq: Two times a day (BID) | ORAL | 3 refills | Status: DC

## 2018-09-05 NOTE — Progress Notes (Signed)
Virtual Visit via Video Note  I connected with Raven Weaver on 09/05/18 at 10:00 AM EDT by a video enabled telemedicine application and verified that I am speaking with the correct person using two identifiers.  Location patient: home Location provider:work or home office Persons participating in the virtual visit: patient, provider  I discussed the limitations of evaluation and management by telemedicine and the availability of in person appointments. The patient expressed understanding and agreed to proceed.   HPI: Pt is a 58 yo female with pmh sig for HTN, DM II, GERD, h/o tobacco use, seasonal allergies, menopausal.  Seen for f/u on chronic conditions and TOC. previously seen by Dr. Maudie Mercury.  DM II: -pt does not check her bs -taking Metformin 1000 mg BID -eating fruit, chicken, beef, vegetables -does not exercise.  States it is too hot.  HTN: -checks at home. -127/75 -taking lisinopril and metoprolol -needs refill on metoprolol -denies dry cough -seen by Cardiology, Dr. Skeet Latch  Hot flashes: -pt is menopausal -states she goes to bed hot and wakes up hot -sitting under the ceiling fan.  Has a portable fan she keeps nearby -asks about not prescription meds to helps with symptoms  Seasonal allergies: -state had a cough, but nasal spray cleared it up.  Allergies: NKDA  ROS: See pertinent positives and negatives per HPI.  Past Medical History:  Diagnosis Date  . Abnormal Pap smear of cervix    remote, > 25 years ago, s/p cone bxs, all normal since per her report  . Diabetes mellitus without complication ([place])   . Essential hypertension 04/08/2015  . GERD (gastroesophageal reflux disease)   . History of kidney stones   . Hyperglycemia    gestational diabetes  . Kidney stones    remote, > 30 years ago  . PVC (premature ventricular contraction) 12/26/2014  . SVT (supraventricular tachycardia) ([place]) 12/26/2014  . Tobacco use     Past Surgical History:   Procedure Laterality Date  . ABLATION     uterine  . CESAREAN SECTION      Family History  Problem Relation Age of Onset  . Hypertension Mother   . Diabetes Mother   . Heart disease Mother 21       MI  . CAD Mother 62  . Cancer Maternal Aunt   . Diabetes Maternal Grandfather   . Hypertension Maternal Grandfather   . Cancer Paternal Grandmother   . Colon cancer Neg Hx      Current Outpatient Medications:  .  lisinopril (ZESTRIL) 10 MG tablet, Take 1 tablet (10 mg total) by mouth daily., Disp: 30 tablet, Rfl: 0 .  metFORMIN (GLUCOPHAGE) 1000 MG tablet, Take 1 tablet (1,000 mg total) by mouth 2 (two) times daily with a meal., Disp: 180 tablet, Rfl: 3 .  metoprolol tartrate (LOPRESSOR) 50 MG tablet, Take 1 tablet by mouth twice daily, Disp: 60 tablet, Rfl: 0 .  NITROSTAT 0.4 MG SL tablet, PLACE 1 TABLET (0.4 MG TOTAL) UNDER THE TONGUE EVERY 5 (FIVE) MINUTES AS NEEDED FOR CHEST PAIN., Disp: 25 tablet, Rfl: 2  EXAM:  VITALS per patient if applicable: RR between 57-84 bpm  GENERAL: alert, oriented, appears well and in no acute distress  HEENT: atraumatic, conjunctiva clear, no obvious abnormalities on inspection of external nose and ears  NECK: normal movements of the head and neck  LUNGS: on inspection no signs of respiratory distress, breathing rate appears normal, no obvious gross SOB, gasping or wheezing  CV: no obvious  cyanosis  MS: moves all visible extremities without noticeable abnormality  PSYCH/NEURO: irritable and minimally cooperative, no obvious depression or anxiety, speech and thought processing grossly intact  ASSESSMENT AND PLAN:  Discussed the following assessment and plan:  Essential hypertension  -controlled -continue lifestyle modifications -continue current medications lisinopril and lopressor -offered to refill lisinopril, but pt states she had enough -continue f/u with Cardiology - Plan: metoprolol tartrate (LOPRESSOR) 50 MG tablet  Hot  flashes -consider black coash  Type 2 diabetes mellitus without complication, without long-term current use of insulin (HCC)  -last hgb A1C 7.3% on 05/09/18 -continue metformin 1000 mg BID -continue lifestyle modifications  Seasonal allergies -continue OTC med prn  F/u in 3-6 months prn   I discussed the assessment and treatment plan with the patient. The patient was provided an opportunity to ask questions and all were answered. The patient agreed with the plan and demonstrated an understanding of the instructions.   The patient was advised to call back or seek an in-person evaluation if the symptoms worsen or if the condition fails to improve as anticipated.  Billie Ruddy, MD

## 2018-09-27 ENCOUNTER — Other Ambulatory Visit: Payer: Self-pay | Admitting: Cardiovascular Disease

## 2018-09-27 MED ORDER — NITROGLYCERIN 0.4 MG SL SUBL: 0.4000 mg | SUBLINGUAL_TABLET | SUBLINGUAL | 2 refills | Status: DC | PRN

## 2018-09-27 NOTE — Telephone Encounter (Signed)
°*  STAT* If patient is at the pharmacy, call can be transferred to refill team.   1. Which medications need to be refilled? (please list name of each medication and dose if known) NITROSTAT 0.4 MG SL tablet  2. Which pharmacy/location (including street and city if local pharmacy) is medication to be sent to? [place], [place]  3. Do they need a 30 day or 90 day supply? 30 day

## 2019-07-03 ENCOUNTER — Other Ambulatory Visit: Payer: Self-pay | Admitting: Family Medicine

## 2019-07-04 NOTE — Telephone Encounter (Signed)
Pt needs appointment for further refills 

## 2019-07-11 ENCOUNTER — Telehealth: Payer: Self-pay | Admitting: Family Medicine

## 2019-07-11 NOTE — Telephone Encounter (Signed)
Pt call and stated she need a refill on metFORMIN (GLUCOPHAGE) 1000  Sent to  [facility], [place]. Phone:  [phone]  Fax:  [phone]

## 2019-07-13 ENCOUNTER — Other Ambulatory Visit: Payer: Self-pay

## 2019-07-13 MED ORDER — METFORMIN HCL 1000 MG PO TABS: 1000.0000 mg | ORAL_TABLET | Freq: Two times a day (BID) | ORAL | 0 refills | Status: DC

## 2019-07-13 NOTE — Telephone Encounter (Signed)
Rx sent to pt pharmacy 

## 2019-08-06 ENCOUNTER — Other Ambulatory Visit: Payer: Self-pay | Admitting: Family Medicine

## 2019-09-05 ENCOUNTER — Other Ambulatory Visit: Payer: Self-pay | Admitting: Family Medicine

## 2019-10-07 ENCOUNTER — Other Ambulatory Visit: Payer: Self-pay | Admitting: Family Medicine

## 2019-10-09 NOTE — Telephone Encounter (Signed)
Pt needs appointment for further refills 

## 2019-10-16 ENCOUNTER — Other Ambulatory Visit: Payer: Self-pay | Admitting: Family Medicine

## 2019-10-16 DIAGNOSIS — I10 Essential (primary) hypertension: Secondary | ICD-10-CM

## 2019-10-26 ENCOUNTER — Encounter: Payer: Self-pay | Admitting: Family Medicine

## 2019-11-01 ENCOUNTER — Other Ambulatory Visit: Payer: Self-pay

## 2019-11-02 ENCOUNTER — Ambulatory Visit (INDEPENDENT_AMBULATORY_CARE_PROVIDER_SITE_OTHER): Payer: Self-pay | Admitting: Family Medicine

## 2019-11-02 ENCOUNTER — Encounter: Payer: Self-pay | Admitting: Family Medicine

## 2019-11-02 VITALS — BP 126/72 | HR 68 | Temp 98.3°F | Wt 170.0 lb

## 2019-11-02 DIAGNOSIS — I10 Essential (primary) hypertension: Secondary | ICD-10-CM

## 2019-11-02 DIAGNOSIS — Z0000 Encounter for general adult medical examination without abnormal findings: Secondary | ICD-10-CM

## 2019-11-02 DIAGNOSIS — E1169 Type 2 diabetes mellitus with other specified complication: Secondary | ICD-10-CM

## 2019-11-02 MED ORDER — METFORMIN HCL 1000 MG PO TABS: ORAL_TABLET | ORAL | 2 refills | Status: DC

## 2019-11-02 MED ORDER — METOPROLOL TARTRATE 50 MG PO TABS: 50.0000 mg | ORAL_TABLET | Freq: Two times a day (BID) | ORAL | 2 refills | Status: DC

## 2019-11-02 MED ORDER — LISINOPRIL 10 MG PO TABS: 10.0000 mg | ORAL_TABLET | Freq: Every day | ORAL | 2 refills | Status: DC

## 2019-11-02 NOTE — Patient Instructions (Addendum)
You can call the breast center at [facility] at [phone] to schedule a mammogram.  October is Breast Cancer awareness month.    Preventive Care 30-60 Years Old, Female Preventive care refers to visits with your health care provider and lifestyle choices that can promote health and wellness. This includes:  A yearly physical exam. This may also be called an annual well check.  Regular dental visits and eye exams.  Immunizations.  Screening for certain conditions.  Healthy lifestyle choices, such as eating a healthy diet, getting regular exercise, not using drugs or products that contain nicotine and tobacco, and limiting alcohol use. What can I expect for my preventive care visit? Physical exam Your health care provider will check your:  Height and weight. This may be used to calculate body mass index (BMI), which tells if you are at a healthy weight.  Heart rate and blood pressure.  Skin for abnormal spots. Counseling Your health care provider may ask you questions about your:  Alcohol, tobacco, and drug use.  Emotional well-being.  Home and relationship well-being.  Sexual activity.  Eating habits.  Work and work Statistician.  Method of birth control.  Menstrual cycle.  Pregnancy history. What immunizations do I need?  Influenza (flu) vaccine  This is recommended every year. Tetanus, diphtheria, and pertussis (Tdap) vaccine  You may need a Td booster every 10 years. Varicella (chickenpox) vaccine  You may need this if you have not been vaccinated. Zoster (shingles) vaccine  You may need this after age 64. Measles, mumps, and rubella (MMR) vaccine  You may need at least one dose of MMR if you were born in 1957 or later. You may also need a second dose. Pneumococcal conjugate (PCV13) vaccine  You may need this if you have certain conditions and were not previously vaccinated. Pneumococcal polysaccharide (PPSV23) vaccine  You may need one or  two doses if you smoke cigarettes or if you have certain conditions. Meningococcal conjugate (MenACWY) vaccine  You may need this if you have certain conditions. Hepatitis A vaccine  You may need this if you have certain conditions or if you travel or work in places where you may be exposed to hepatitis A. Hepatitis B vaccine  You may need this if you have certain conditions or if you travel or work in places where you may be exposed to hepatitis B. Haemophilus influenzae type b (Hib) vaccine  You may need this if you have certain conditions. Human papillomavirus (HPV) vaccine  If recommended by your health care provider, you may need three doses over 6 months. You may receive vaccines as individual doses or as more than one vaccine together in one shot (combination vaccines). Talk with your health care provider about the risks and benefits of combination vaccines. What tests do I need? Blood tests  Lipid and cholesterol levels. These may be checked every 5 years, or more frequently if you are over 35 years old.  Hepatitis C test.  Hepatitis B test. Screening  Lung cancer screening. You may have this screening every year starting at age 41 if you have a 30-pack-year history of smoking and currently smoke or have quit within the past 15 years.  Colorectal cancer screening. All adults should have this screening starting at age 30 and continuing until age 81. Your health care provider may recommend screening at age 79 if you are at increased risk. You will have tests every 1-10 years, depending on your results and the type of screening test.  Diabetes screening. This is done by checking your blood sugar (glucose) after you have not eaten for a while (fasting). You may have this done every 1-3 years.  Mammogram. This may be done every 1-2 years. Talk with your health care provider about when you should start having regular mammograms. This may depend on whether you have a family history  of breast cancer.  BRCA-related cancer screening. This may be done if you have a family history of breast, ovarian, tubal, or peritoneal cancers.  Pelvic exam and Pap test. This may be done every 3 years starting at age 62. Starting at age 39, this may be done every 5 years if you have a Pap test in combination with an HPV test. Other tests  Sexually transmitted disease (STD) testing.  Bone density scan. This is done to screen for osteoporosis. You may have this scan if you are at high risk for osteoporosis. Follow these instructions at home: Eating and drinking  Eat a diet that includes fresh fruits and vegetables, whole grains, lean protein, and low-fat dairy.  Take vitamin and mineral supplements as recommended by your health care provider.  Do not drink alcohol if: ? Your health care provider tells you not to drink. ? You are pregnant, may be pregnant, or are planning to become pregnant.  If you drink alcohol: ? Limit how much you have to 0-1 drink a day. ? Be aware of how much alcohol is in your drink. In the [place] one drink equals one 12 oz bottle of beer (355 mL), one 5 oz glass of wine (148 mL), or one 1 oz glass of hard liquor (44 mL). Lifestyle  Take daily care of your teeth and gums.  Stay active. Exercise for at least 30 minutes on 5 or more days each week.  Do not use any products that contain nicotine or tobacco, such as cigarettes, e-cigarettes, and chewing tobacco. If you need help quitting, ask your health care provider.  If you are sexually active, practice safe sex. Use a condom or other form of birth control (contraception) in order to prevent pregnancy and STIs (sexually transmitted infections).  If told by your health care provider, take low-dose aspirin daily starting at age 27. What's next?  Visit your health care provider once a year for a well check visit.  Ask your health care provider how often you should have your eyes and teeth checked.  Stay up  to date on all vaccines. This information is not intended to replace advice given to you by your health care provider. Make sure you discuss any questions you have with your health care provider. Document Revised: 10/14/2017 Document Reviewed: 10/14/2017 Elsevier Patient Education  [place] Your Hypertension Hypertension is commonly called high blood pressure. This is when the force of your blood pressing against the walls of your arteries is too strong. Arteries are blood vessels that carry blood from your heart throughout your body. Hypertension forces the heart to work harder to pump blood, and may cause the arteries to become narrow or stiff. Having untreated or uncontrolled hypertension can cause heart attack, stroke, kidney disease, and other problems. What are blood pressure readings? A blood pressure reading consists of a higher number over a lower number. Ideally, your blood pressure should be below 120/80. The first ("top") number is called the systolic pressure. It is a measure of the pressure in your arteries as your heart beats. The second ("bottom") number is called the diastolic  pressure. It is a measure of the pressure in your arteries as the heart relaxes. What does my blood pressure reading mean? Blood pressure is classified into four stages. Based on your blood pressure reading, your health care provider may use the following stages to determine what type of treatment you need, if any. Systolic pressure and diastolic pressure are measured in a unit called mm Hg. Normal  Systolic pressure: below 631.  Diastolic pressure: below 80. Elevated  Systolic pressure: 497-026.  Diastolic pressure: below 80. Hypertension stage 1  Systolic pressure: 378-588.  Diastolic pressure: 50-27. Hypertension stage 2  Systolic pressure: 741 or above.  Diastolic pressure: 90 or above. What health risks are associated with hypertension? Managing your hypertension is an  important responsibility. Uncontrolled hypertension can lead to:  A heart attack.  A stroke.  A weakened blood vessel (aneurysm).  Heart failure.  Kidney damage.  Eye damage.  Metabolic syndrome.  Memory and concentration problems. What changes can I make to manage my hypertension? Hypertension can be managed by making lifestyle changes and possibly by taking medicines. Your health care provider will help you make a plan to bring your blood pressure within a normal range. Eating and drinking   Eat a diet that is high in fiber and potassium, and low in salt (sodium), added sugar, and fat. An example eating plan is called the DASH (Dietary Approaches to Stop Hypertension) diet. To eat this way: ? Eat plenty of fresh fruits and vegetables. Try to fill half of your plate at each meal with fruits and vegetables. ? Eat whole grains, such as whole wheat pasta, brown rice, or whole grain bread. Fill about one quarter of your plate with whole grains. ? Eat low-fat diary products. ? Avoid fatty cuts of meat, processed or cured meats, and poultry with skin. Fill about one quarter of your plate with lean proteins such as fish, chicken without skin, beans, eggs, and tofu. ? Avoid premade and processed foods. These tend to be higher in sodium, added sugar, and fat.  Reduce your daily sodium intake. Most people with hypertension should eat less than 1,500 mg of sodium a day.  Limit alcohol intake to no more than 1 drink a day for nonpregnant women and 2 drinks a day for men. One drink equals 12 oz of beer, 5 oz of wine, or 1 oz of hard liquor. Lifestyle  Work with your health care provider to maintain a healthy body weight, or to lose weight. Ask what an ideal weight is for you.  Get at least 30 minutes of exercise that causes your heart to beat faster (aerobic exercise) most days of the week. Activities may include walking, swimming, or biking.  Include exercise to strengthen your muscles  (resistance exercise), such as weight lifting, as part of your weekly exercise routine. Try to do these types of exercises for 30 minutes at least 3 days a week.  Do not use any products that contain nicotine or tobacco, such as cigarettes and e-cigarettes. If you need help quitting, ask your health care provider.  Control any long-term (chronic) conditions you have, such as high cholesterol or diabetes. Monitoring  Monitor your blood pressure at home as told by your health care provider. Your personal target blood pressure may vary depending on your medical conditions, your age, and other factors.  Have your blood pressure checked regularly, as often as told by your health care provider. Working with your health care provider  Review all the medicines  you take with your health care provider because there may be side effects or interactions.  Talk with your health care provider about your diet, exercise habits, and other lifestyle factors that may be contributing to hypertension.  Visit your health care provider regularly. Your health care provider can help you create and adjust your plan for managing hypertension. Will I need medicine to control my blood pressure? Your health care provider may prescribe medicine if lifestyle changes are not enough to get your blood pressure under control, and if:  Your systolic blood pressure is 130 or higher.  Your diastolic blood pressure is 80 or higher. Take medicines only as told by your health care provider. Follow the directions carefully. Blood pressure medicines must be taken as prescribed. The medicine does not work as well when you skip doses. Skipping doses also puts you at risk for problems. Contact a health care provider if:  You think you are having a reaction to medicines you have taken.  You have repeated (recurrent) headaches.  You feel dizzy.  You have swelling in your ankles.  You have trouble with your vision. Get help right  away if:  You develop a severe headache or confusion.  You have unusual weakness or numbness, or you feel faint.  You have severe pain in your chest or abdomen.  You vomit repeatedly.  You have trouble breathing. Summary  Hypertension is when the force of blood pumping through your arteries is too strong. If this condition is not controlled, it may put you at risk for serious complications.  Your personal target blood pressure may vary depending on your medical conditions, your age, and other factors. For most people, a normal blood pressure is less than 120/80.  Hypertension is managed by lifestyle changes, medicines, or both. Lifestyle changes include weight loss, eating a healthy, low-sodium diet, exercising more, and limiting alcohol. This information is not intended to replace advice given to you by your health care provider. Make sure you discuss any questions you have with your health care provider. Document Revised: 05/27/2018 Document Reviewed: 01/01/2016 Elsevier Patient Education  2020 [employer].  Diabetes Basics  Diabetes (diabetes mellitus) is a long-term (chronic) disease. It occurs when the body does not properly use sugar (glucose) that is released from food after you eat. Diabetes may be caused by one or both of these problems:  Your pancreas does not make enough of a hormone called insulin.  Your body does not react in a normal way to insulin that it makes. Insulin lets sugars (glucose) go into cells in your body. This gives you energy. If you have diabetes, sugars cannot get into cells. This causes high blood sugar (hyperglycemia). Follow these instructions at home: How is diabetes treated? You may need to take insulin or other diabetes medicines daily to keep your blood sugar in balance. Take your diabetes medicines every day as told by your doctor. List your diabetes medicines here: Diabetes medicines  Name of medicine:  ______________________________ ? Amount (dose): _______________ Time (a.m./p.m.): _______________ Notes: ___________________________________  Name of medicine: ______________________________ ? Amount (dose): _______________ Time (a.m./p.m.): _______________ Notes: ___________________________________  Name of medicine: ______________________________ ? Amount (dose): _______________ Time (a.m./p.m.): _______________ Notes: ___________________________________ If you use insulin, you will learn how to give yourself insulin by injection. You may need to adjust the amount based on the food that you eat. List the types of insulin you use here: Insulin  Insulin type: ______________________________ ? Amount (dose): _______________ Time (a.m./p.m.): _______________ Notes: ___________________________________  Insulin type: ______________________________ ? Amount (dose): _______________ Time (a.m./p.m.): _______________ Notes: ___________________________________  Insulin type: ______________________________ ? Amount (dose): _______________ Time (a.m./p.m.): _______________ Notes: ___________________________________  Insulin type: ______________________________ ? Amount (dose): _______________ Time (a.m./p.m.): _______________ Notes: ___________________________________  Insulin type: ______________________________ ? Amount (dose): _______________ Time (a.m./p.m.): _______________ Notes: ___________________________________ How do I manage my blood sugar?  Check your blood sugar levels using a blood glucose monitor as directed by your doctor. Your doctor will set treatment goals for you. Generally, you should have these blood sugar levels:  Before meals (preprandial): 80-130 mg/dL (4.4-7.2 mmol/L).  After meals (postprandial): below 180 mg/dL (10 mmol/L).  A1c level: less than 7%. Write down the times that you will check your blood sugar levels: Blood sugar checks  Time: _______________ Notes:  ___________________________________  Time: _______________ Notes: ___________________________________  Time: _______________ Notes: ___________________________________  Time: _______________ Notes: ___________________________________  Time: _______________ Notes: ___________________________________  Time: _______________ Notes: ___________________________________  What do I need to know about low blood sugar? Low blood sugar is called hypoglycemia. This is when blood sugar is at or below 70 mg/dL (3.9 mmol/L). Symptoms may include:  Feeling: ? Hungry. ? Worried or nervous (anxious). ? Sweaty and clammy. ? Confused. ? Dizzy. ? Sleepy. ? Sick to your stomach (nauseous).  Having: ? A fast heartbeat. ? A headache. ? A change in your vision. ? Tingling or no feeling (numbness) around the mouth, lips, or tongue. ? Jerky movements that you cannot control (seizure).  Having trouble with: ? Moving (coordination). ? Sleeping. ? Passing out (fainting). ? Getting upset easily (irritability). Treating low blood sugar To treat low blood sugar, eat or drink something sugary right away. If you can think clearly and swallow safely, follow the 15:15 rule:  Take 15 grams of a fast-acting carb (carbohydrate). Talk with your doctor about how much you should take.  Some fast-acting carbs are: ? Sugar tablets (glucose pills). Take 3-4 glucose pills. ? 6-8 pieces of hard candy. ? 4-6 oz (120-150 mL) of fruit juice. ? 4-6 oz (120-150 mL) of regular (not diet) soda. ? 1 Tbsp (15 mL) honey or sugar.  Check your blood sugar 15 minutes after you take the carb.  If your blood sugar is still at or below 70 mg/dL (3.9 mmol/L), take 15 grams of a carb again.  If your blood sugar does not go above 70 mg/dL (3.9 mmol/L) after 3 tries, get help right away.  After your blood sugar goes back to normal, eat a meal or a snack within 1 hour. Treating very low blood sugar If your blood sugar is at or  below 54 mg/dL (3 mmol/L), you have very low blood sugar (severe hypoglycemia). This is an emergency. Do not wait to see if the symptoms will go away. Get medical help right away. Call your local emergency services (911 in the [place] Do not drive yourself to the hospital. Questions to ask your health care provider  Do I need to meet with a diabetes educator?  What equipment will I need to care for myself at home?  What diabetes medicines do I need? When should I take them?  How often do I need to check my blood sugar?  What number can I call if I have questions?  When is my next doctor's visit?  Where can I find a support group for people with diabetes? Where to find more information  American Diabetes Association: www.diabetes.org  American Association of Diabetes Educators: www.diabeteseducator.org/patient-resources Contact a doctor if:  Your  blood sugar is at or above 240 mg/dL (13.3 mmol/L) for 2 days in a row.  You have been sick or have had a fever for 2 days or more, and you are not getting better.  You have any of these problems for more than 6 hours: ? You cannot eat or drink. ? You feel sick to your stomach (nauseous). ? You throw up (vomit). ? You have watery poop (diarrhea). Get help right away if:  Your blood sugar is lower than 54 mg/dL (3 mmol/L).  You get confused.  You have trouble: ? Thinking clearly. ? Breathing. Summary  Diabetes (diabetes mellitus) is a long-term (chronic) disease. It occurs when the body does not properly use sugar (glucose) that is released from food after digestion.  Take insulin and diabetes medicines as told.  Check your blood sugar every day, as often as told.  Keep all follow-up visits as told by your doctor. This is important. This information is not intended to replace advice given to you by your health care provider. Make sure you discuss any questions you have with your health care provider. Document Revised:  10/26/2018 Document Reviewed: 05/07/2017 Elsevier Patient Education  [place].  Diabetes Mellitus and Nutrition, Adult When you have diabetes (diabetes mellitus), it is very important to have healthy eating habits because your blood sugar (glucose) levels are greatly affected by what you eat and drink. Eating healthy foods in the appropriate amounts, at about the same times every day, can help you:  Control your blood glucose.  Lower your risk of heart disease.  Improve your blood pressure.  Reach or maintain a healthy weight. Every person with diabetes is different, and each person has different needs for a meal plan. Your health care provider may recommend that you work with a diet and nutrition specialist (dietitian) to make a meal plan that is best for you. Your meal plan may vary depending on factors such as:  The calories you need.  The medicines you take.  Your weight.  Your blood glucose, blood pressure, and cholesterol levels.  Your activity level.  Other health conditions you have, such as heart or kidney disease. How do carbohydrates affect me? Carbohydrates, also called carbs, affect your blood glucose level more than any other type of food. Eating carbs naturally raises the amount of glucose in your blood. Carb counting is a method for keeping track of how many carbs you eat. Counting carbs is important to keep your blood glucose at a healthy level, especially if you use insulin or take certain oral diabetes medicines. It is important to know how many carbs you can safely have in each meal. This is different for every person. Your dietitian can help you calculate how many carbs you should have at each meal and for each snack. Foods that contain carbs include:  Bread, cereal, rice, pasta, and crackers.  Potatoes and corn.  Peas, beans, and lentils.  Milk and yogurt.  Fruit and juice.  Desserts, such as cakes, cookies, ice cream, and candy. How does  alcohol affect me? Alcohol can cause a sudden decrease in blood glucose (hypoglycemia), especially if you use insulin or take certain oral diabetes medicines. Hypoglycemia can be a life-threatening condition. Symptoms of hypoglycemia (sleepiness, dizziness, and confusion) are similar to symptoms of having too much alcohol. If your health care provider says that alcohol is safe for you, follow these guidelines:  Limit alcohol intake to no more than 1 drink per day for nonpregnant  women and 2 drinks per day for men. One drink equals 12 oz of beer, 5 oz of wine, or 1 oz of hard liquor.  Do not drink on an empty stomach.  Keep yourself hydrated with water, diet soda, or unsweetened iced tea.  Keep in mind that regular soda, juice, and other mixers may contain a lot of sugar and must be counted as carbs. What are tips for following this plan?  Reading food labels  Start by checking the serving size on the "Nutrition Facts" label of packaged foods and drinks. The amount of calories, carbs, fats, and other nutrients listed on the label is based on one serving of the item. Many items contain more than one serving per package.  Check the total grams (g) of carbs in one serving. You can calculate the number of servings of carbs in one serving by dividing the total carbs by 15. For example, if a food has 30 g of total carbs, it would be equal to 2 servings of carbs.  Check the number of grams (g) of saturated and trans fats in one serving. Choose foods that have low or no amount of these fats.  Check the number of milligrams (mg) of salt (sodium) in one serving. Most people should limit total sodium intake to less than 2,300 mg per day.  Always check the nutrition information of foods labeled as "low-fat" or "nonfat". These foods may be higher in added sugar or refined carbs and should be avoided.  Talk to your dietitian to identify your daily goals for nutrients listed on the  label. Shopping  Avoid buying canned, premade, or processed foods. These foods tend to be high in fat, sodium, and added sugar.  Shop around the outside edge of the grocery store. This includes fresh fruits and vegetables, bulk grains, fresh meats, and fresh dairy. Cooking  Use low-heat cooking methods, such as baking, instead of high-heat cooking methods like deep frying.  Cook using healthy oils, such as olive, canola, or sunflower oil.  Avoid cooking with butter, cream, or high-fat meats. Meal planning  Eat meals and snacks regularly, preferably at the same times every day. Avoid going long periods of time without eating.  Eat foods high in fiber, such as fresh fruits, vegetables, beans, and whole grains. Talk to your dietitian about how many servings of carbs you can eat at each meal.  Eat 4-6 ounces (oz) of lean protein each day, such as lean meat, chicken, fish, eggs, or tofu. One oz of lean protein is equal to: ? 1 oz of meat, chicken, or fish. ? 1 egg. ?  cup of tofu.  Eat some foods each day that contain healthy fats, such as avocado, nuts, seeds, and fish. Lifestyle  Check your blood glucose regularly.  Exercise regularly as told by your health care provider. This may include: ? 150 minutes of moderate-intensity or vigorous-intensity exercise each week. This could be brisk walking, biking, or water aerobics. ? Stretching and doing strength exercises, such as yoga or weightlifting, at least 2 times a week.  Take medicines as told by your health care provider.  Do not use any products that contain nicotine or tobacco, such as cigarettes and e-cigarettes. If you need help quitting, ask your health care provider.  Work with a Social worker or diabetes educator to identify strategies to manage stress and any emotional and social challenges. Questions to ask a health care provider  Do I need to meet with a diabetes educator?  Do I need to meet with a dietitian?  What  number can I call if I have questions?  When are the best times to check my blood glucose? Where to find more information:  American Diabetes Association: diabetes.org  Academy of Nutrition and Dietetics: www.eatright.[employer] of Diabetes and Digestive and Kidney Diseases (NIH): [employer] Summary  A healthy meal plan will help you control your blood glucose and maintain a healthy lifestyle.  Working with a diet and nutrition specialist (dietitian) can help you make a meal plan that is best for you.  Keep in mind that carbohydrates (carbs) and alcohol have immediate effects on your blood glucose levels. It is important to count carbs and to use alcohol carefully. This information is not intended to replace advice given to you by your health care provider. Make sure you discuss any questions you have with your health care provider. Document Revised: 01/15/2017 Document Reviewed: 03/09/2016 Elsevier Patient Education  2020 [employer].

## 2019-11-02 NOTE — Progress Notes (Signed)
Subjective:     Raven Weaver is a 59 y.o. female and is here for a comprehensive physical exam. The patient reports no problems.  Pt states she is fine.  Checking bp at home, typically 136/77, 134/78, 128/22, 127/75.  Patient is not checking FSBS at home but states her blood sugars fine.  Patient taking Metformin 1000 mg twice daily. Requesting refills on all meds.  Pt has not had mammogram 2/2 not having insurance.  States does not qualify for medicaid.  Pt has not had a colonoscopy as was advised to have it done at the hospital 2/2 her "heart condition".  Social History   Socioeconomic History  . Marital status: Married    Spouse name: Not on file  . Number of children: Not on file  . Years of education: Not on file  . Highest education level: Not on file  Occupational History  . Not on file  Tobacco Use  . Smoking status: Former Smoker    Packs/day: 0.25    Types: Cigarettes    Quit date: 12/19/2014    Years since quitting: 4.8  . Smokeless tobacco: Never Used  . Tobacco comment: per patient quit 8 weeks ago  Substance and Sexual Activity  . Alcohol use: No    Alcohol/week: 0.0 standard drinks    Comment: 1 glass every 2 weeks  . Drug use: No  . Sexual activity: Not on file  Other Topics Concern  . Not on file  Social History Narrative   Work or School: full time for [employer] Situation: lives with mom and kids home during the summer      Spiritual Beliefs: Christian      Lifestyle: walks 2-3 days per week; diet is fair per her report - avoid fried foods - tries to eat veggies and lean meat      Social Determinants of Health   Financial Resource Strain:   . Difficulty of Paying Living Expenses: Not on file  Food Insecurity:   . Worried About Charity fundraiser in the Last Year: Not on file  . Ran Out of Food in the Last Year: Not on file  Transportation Needs:   . Lack of Transportation (Medical): Not on file  . Lack of Transportation (Non-Medical):  Not on file  Physical Activity:   . Days of Exercise per Week: Not on file  . Minutes of Exercise per Session: Not on file  Stress:   . Feeling of Stress : Not on file  Social Connections:   . Frequency of Communication with Friends and Family: Not on file  . Frequency of Social Gatherings with Friends and Family: Not on file  . Attends Religious Services: Not on file  . Active Member of Clubs or Organizations: Not on file  . Attends Archivist Meetings: Not on file  . Marital Status: Not on file  Intimate Partner Violence:   . Fear of Current or Ex-Partner: Not on file  . Emotionally Abused: Not on file  . Physically Abused: Not on file  . Sexually Abused: Not on file   Health Maintenance  Topic Date Due  . COVID-19 Vaccine (1) Never done  . COLONOSCOPY  Never done  . OPHTHALMOLOGY EXAM  03/20/2016  . FOOT EXAM  08/11/2016  . MAMMOGRAM  09/16/2016  . PAP SMEAR-Modifier  09/16/2017  . HEMOGLOBIN A1C  11/09/2018  . INFLUENZA VACCINE  09/17/2019  . TETANUS/TDAP  08/06/2024  .  PNEUMOCOCCAL POLYSACCHARIDE VACCINE AGE 29-64 HIGH RISK  Completed  . HIV Screening  Completed  . Hepatitis C Screening  Addressed    The following portions of the patient's history were reviewed and updated as appropriate: allergies, current medications, past family history, past medical history, past social history, past surgical history and problem list.  Review of Systems Pertinent items noted in HPI and remainder of comprehensive ROS otherwise negative.   Objective:    BP 126/72 (BP Location: Left Arm, Patient Position: Sitting, Cuff Size: Normal)   Pulse 68   Temp 98.3 F (36.8 C) (Oral)   Wt 170 lb (77.1 kg)   LMP 02/16/2006   SpO2 98%   BMI 29.18 kg/m  General appearance: alert, cooperative and no distress Head: Normocephalic, without obvious abnormality, atraumatic Eyes: conjunctivae/corneas clear. PERRL, EOM's intact. Fundi benign. Ears: cerumen impaction in r canal.  L  canal and TM normal.  B/l external ear ears normal. Nose: Nares normal. Septum midline. Mucosa normal. No drainage or sinus tenderness. Throat: lips, mucosa, and tongue normal; teeth and gums normal Neck: no adenopathy, no carotid bruit, no JVD, supple, symmetrical, trachea midline and thyroid not enlarged, symmetric, no tenderness/mass/nodules Lungs: clear to auscultation bilaterally Heart: regular rate and rhythm, S1, S2 normal, no murmur, click, rub or gallop Abdomen: soft, non-tender; bowel sounds normal; no masses,  no organomegaly Extremities: extremities normal, atraumatic, no cyanosis or edema Pulses: 2+ and symmetric Skin: Skin color, texture, turgor normal. No rashes or lesions Lymph nodes: Cervical, supraclavicular, and axillary nodes normal. Neurologic: Alert and oriented X 3, normal strength and tone. Normal symmetric reflexes. Normal coordination and gait    Assessment:    Healthy female exam.      Plan:     Anticipatory guidance given including wearing seatbelts, smoke detectors in the home, increasing physical activity, increasing p.o. intake of water and vegetables. -will obtain labs -pt declines colonoscopy, pap, mammogram 2/2 cost.  Advised to look into area programs that offer free mammograms in October for breast cancer awareness month. -given handout -next CPE in 1 yr See After Visit Summary for Counseling Recommendations    Essential hypertension  -Controlled -Continue metoprolol tartrate 50 mg twice daily and lisinopril 10 mg daily. -Continue lifestyle modifications. - Plan: BMP with eGFR([facility]), lisinopril (ZESTRIL) 10 MG tablet, metoprolol tartrate (LOPRESSOR) 50 MG tablet  Type 2 diabetes mellitus with other specified complication, without long-term current use of insulin (HCC)  -Discussed lifestyle modifications -Hemoglobin A1c 7.3% on 05/09/2018 -Diabetic retinopathy exams advised. - Plan: Lipid panel, Hemoglobin A1c, Microalbumin/Creatinine Ratio,  Urine, metFORMIN (GLUCOPHAGE) 1000 MG tablet  F/u prn  Grier Mitts, MD

## 2019-11-03 LAB — HEMOGLOBIN A1C
Hgb A1c MFr Bld: 6.1 % of total Hgb — ABNORMAL HIGH (ref <5.7)
Mean Plasma Glucose: 128 (calc)
eAG (mmol/L): 7.1 (calc)

## 2019-11-03 LAB — LIPID PANEL
Cholesterol: 161 mg/dL (ref <200)
HDL: 52 mg/dL (ref >=50)
LDL Cholesterol (Calc): 92 mg/dL (calc)
Non-HDL Cholesterol (Calc): 109 mg/dL (calc) (ref <130)
Total CHOL/HDL Ratio: 3.1 (calc) (ref <5.0)
Triglycerides: 84 mg/dL (ref <150)

## 2019-11-03 LAB — BASIC METABOLIC PANEL WITH GFR
BUN: 8 mg/dL (ref 7–25)
CO2: 28 mmol/L (ref 20–32)
Calcium: 9.7 mg/dL (ref 8.6–10.4)
Chloride: 106 mmol/L (ref 98–110)
Creat: 0.74 mg/dL (ref 0.50–0.99)
GFR, Est African American: 102 mL/min/{1.73_m2} (ref >=60)
GFR, Est Non African American: 88 mL/min/{1.73_m2} (ref >=60)
Glucose, Bld: 83 mg/dL (ref 65–99)
Potassium: 4.3 mmol/L (ref 3.5–5.3)
Sodium: 142 mmol/L (ref 135–146)

## 2019-12-11 ENCOUNTER — Other Ambulatory Visit: Payer: Self-pay | Admitting: Obstetrics and Gynecology

## 2019-12-11 DIAGNOSIS — Z1231 Encounter for screening mammogram for malignant neoplasm of breast: Secondary | ICD-10-CM

## 2020-01-25 ENCOUNTER — Ambulatory Visit
Admission: RE | Admit: 2020-01-25 | Discharge: 2020-01-25 | Disposition: A | Discharge status: Home / Self Care | Payer: No Typology Code available for payment source | Source: Ambulatory Visit | Attending: Obstetrics and Gynecology | Admitting: Obstetrics and Gynecology

## 2020-01-25 ENCOUNTER — Other Ambulatory Visit: Payer: Self-pay

## 2020-01-25 ENCOUNTER — Ambulatory Visit: Payer: No Typology Code available for payment source | Admitting: *Deleted

## 2020-01-25 VITALS — BP 128/82 | Wt 164.7 lb

## 2020-01-25 DIAGNOSIS — Z1211 Encounter for screening for malignant neoplasm of colon: Secondary | ICD-10-CM

## 2020-01-25 DIAGNOSIS — Z1231 Encounter for screening mammogram for malignant neoplasm of breast: Secondary | ICD-10-CM

## 2020-01-25 DIAGNOSIS — Z01419 Encounter for gynecological examination (general) (routine) without abnormal findings: Secondary | ICD-10-CM

## 2020-01-25 NOTE — Addendum Note (Signed)
Addended by: Jonna Clark E on: 01/25/2020 02:06 PM   Modules accepted: Orders

## 2020-01-25 NOTE — Patient Instructions (Signed)
Explained breast self awareness with Janean Sark. Pap smear completed today. Let her know BCCCP will cover Pap smears and HPV typing every 5 years unless has a history of abnormal Pap smears. Referred patient to the [facility] for a screening mammogram on the mobile unit. Appointment scheduled Thursday, January 25, 2020 at 1430. Patient escorted to the mobile unit following BCCCP appointment for her screening mammogram. Let patient know will follow up with her within the next couple weeks with results of Pap smear by letter or phone. Informed patient that the [facility] will follow-up with her within the next couple of months with results of her mammogram by letter or phone. Janean Sark verbalized understanding.  Calin Ellery, Arvil Chaco, RN 1:30 PM

## 2020-01-25 NOTE — Progress Notes (Signed)
Raven Weaver is a 59 y.o. G3P0 female who presents to [facility] clinic today with no complaints.    Pap Smear: Pap smear completed today. Last Pap smear was 3 years ago at [facility] clinic and was normal per patient. Per patient has history of an abnormal Pap smear 35-40 years ago that a colposcopy was completed for follow-up. Patient stated all Pap smears have been normal since colposcopy and has had more than three normal Pap smears. Last Pap smear result is not available in Epic.   Physical exam: Breasts Left breast larger than right breast that per patient is normal for her. No skin abnormalities bilateral breasts. No nipple retraction bilateral breasts. No nipple discharge bilateral breasts. No lymphadenopathy. No lumps palpated bilateral breasts. No complaints of pain or tenderness on exam.       Pelvic/Bimanual Ext Genitalia No lesions, no swelling and no discharge observed on external genitalia.        Vagina Vagina pink and normal texture. No lesions or discharge observed in vagina.        Cervix Cervix is present. Cervix pink and of normal texture. No discharge observed.    Uterus Uterus is present and palpable. Uterus in normal position and normal size.        Adnexae Bilateral ovaries present and palpable. No tenderness on palpation.         Rectovaginal No rectal exam completed today since patient had no rectal complaints. No skin abnormalities observed on exam.     Smoking History: Patient is a former smoker that quit in 2016.  Patient Navigation: Patient education provided. Access to services provided for patient through [place] program.  Colorectal Cancer Screening: Per patient has never had colonoscopy completed. FIT Test given to patient to complete. No complaints today.    Breast and Cervical Cancer Risk Assessment: Patient does not have family history of breast cancer, known genetic mutations, or radiation treatment to the chest before age 89. Per  patient has history of cervical dysplasia. Patient has no history of being immunocompromised or DES exposure in-utero.  Risk Assessment    Risk Scores      01/25/2020   Last edited by: Royston Bake, CMA   5-year risk: 1.4 %   Lifetime risk: 6.9 %          A: BCCCP exam with pap smear No complaints.  P: Referred patient to the [facility] for a screening mammogram on the mobile unit. Appointment scheduled Thursday, January 25, 2020 at 1430.  Raven Parish, RN 01/25/2020 1:29 PM

## 2020-01-29 ENCOUNTER — Telehealth: Payer: Self-pay

## 2020-01-29 LAB — CYTOLOGY - PAP
Comment: NEGATIVE
Diagnosis: UNDETERMINED — AB
High risk HPV: NEGATIVE

## 2020-01-29 NOTE — Telephone Encounter (Signed)
Patient informed pap results, ASC-US with negative HPV, needs to repeat pap within 1 year (per Dr. Elly Modena). Patient verbalized understanding.

## 2020-02-08 ENCOUNTER — Other Ambulatory Visit: Payer: Self-pay | Admitting: Obstetrics and Gynecology

## 2020-02-08 DIAGNOSIS — N6489 Other specified disorders of breast: Secondary | ICD-10-CM

## 2020-02-29 ENCOUNTER — Other Ambulatory Visit: Payer: No Typology Code available for payment source

## 2020-03-14 ENCOUNTER — Other Ambulatory Visit: Payer: No Typology Code available for payment source

## 2020-03-25 ENCOUNTER — Ambulatory Visit
Admission: RE | Admit: 2020-03-25 | Discharge: 2020-03-25 | Disposition: A | Discharge status: Home / Self Care | Payer: No Typology Code available for payment source | Source: Ambulatory Visit | Attending: Obstetrics and Gynecology | Admitting: Obstetrics and Gynecology

## 2020-03-25 ENCOUNTER — Ambulatory Visit: Payer: No Typology Code available for payment source

## 2020-03-25 ENCOUNTER — Other Ambulatory Visit: Payer: Self-pay | Admitting: Obstetrics and Gynecology

## 2020-03-25 ENCOUNTER — Other Ambulatory Visit: Payer: Self-pay

## 2020-03-25 DIAGNOSIS — N6310 Unspecified lump in the right breast, unspecified quadrant: Secondary | ICD-10-CM

## 2020-03-25 DIAGNOSIS — N6489 Other specified disorders of breast: Secondary | ICD-10-CM

## 2020-07-18 ENCOUNTER — Telehealth: Payer: Self-pay | Admitting: Cardiovascular Disease

## 2020-07-18 MED ORDER — NITROGLYCERIN 0.4 MG SL SUBL: 0.4000 mg | SUBLINGUAL_TABLET | SUBLINGUAL | 9 refills | Status: DC | PRN

## 2020-07-18 NOTE — Telephone Encounter (Signed)
*  STAT* If patient is at the pharmacy, call can be transferred to refill team.   1. Which medications need to be refilled? (please list name of each medication and dose if known) new prescription for Nitroglycerin  2. Which pharmacy/location (including street and city if local pharmacy) is medication to be sent to? [facility] on [address], [place]  3. Do they need a 30 day or 90 day supply?

## 2020-07-18 NOTE — Telephone Encounter (Signed)
Refills for her Nitro has been sent to pharmacy.

## 2020-09-11 ENCOUNTER — Other Ambulatory Visit: Payer: Self-pay | Admitting: Family Medicine

## 2020-09-11 DIAGNOSIS — I10 Essential (primary) hypertension: Secondary | ICD-10-CM

## 2020-09-15 ENCOUNTER — Other Ambulatory Visit: Payer: Self-pay | Admitting: Family Medicine

## 2020-09-15 DIAGNOSIS — I10 Essential (primary) hypertension: Secondary | ICD-10-CM

## 2020-09-25 ENCOUNTER — Ambulatory Visit
Admission: RE | Admit: 2020-09-25 | Discharge: 2020-09-25 | Disposition: A | Discharge status: Home / Self Care | Payer: No Typology Code available for payment source | Source: Ambulatory Visit | Attending: Obstetrics and Gynecology | Admitting: Obstetrics and Gynecology

## 2020-09-25 ENCOUNTER — Other Ambulatory Visit: Payer: Self-pay

## 2020-09-25 ENCOUNTER — Other Ambulatory Visit: Payer: Self-pay | Admitting: Obstetrics and Gynecology

## 2020-09-25 DIAGNOSIS — N6310 Unspecified lump in the right breast, unspecified quadrant: Secondary | ICD-10-CM

## 2020-09-27 ENCOUNTER — Other Ambulatory Visit: Payer: Self-pay | Admitting: Family Medicine

## 2020-09-27 DIAGNOSIS — E1169 Type 2 diabetes mellitus with other specified complication: Secondary | ICD-10-CM

## 2020-12-03 ENCOUNTER — Other Ambulatory Visit: Payer: Self-pay

## 2020-12-04 ENCOUNTER — Telehealth: Payer: Self-pay

## 2020-12-04 ENCOUNTER — Ambulatory Visit (INDEPENDENT_AMBULATORY_CARE_PROVIDER_SITE_OTHER): Payer: Self-pay

## 2020-12-04 ENCOUNTER — Ambulatory Visit (INDEPENDENT_AMBULATORY_CARE_PROVIDER_SITE_OTHER): Payer: No Typology Code available for payment source | Admitting: Family Medicine

## 2020-12-04 ENCOUNTER — Encounter: Payer: Self-pay | Admitting: Family Medicine

## 2020-12-04 VITALS — BP 138/76 | HR 62 | Temp 98.5°F | Wt 155.2 lb

## 2020-12-04 DIAGNOSIS — M25572 Pain in left ankle and joints of left foot: Secondary | ICD-10-CM

## 2020-12-04 DIAGNOSIS — M25472 Effusion, left ankle: Secondary | ICD-10-CM

## 2020-12-04 LAB — BASIC METABOLIC PANEL
BUN: 9 mg/dL (ref 6–23)
CO2: 27 mEq/L (ref 19–32)
Calcium: 9.6 mg/dL (ref 8.4–10.5)
Chloride: 104 mEq/L (ref 96–112)
Creatinine, Ser: 0.76 mg/dL (ref 0.40–1.20)
GFR: 84.36 mL/min (ref >60.00)
Glucose, Bld: 181 mg/dL — ABNORMAL HIGH (ref 70–99)
Potassium: 3.8 mEq/L (ref 3.5–5.1)
Sodium: 138 mEq/L (ref 135–145)

## 2020-12-04 LAB — CBC WITH DIFFERENTIAL/PLATELET
Basophils Absolute: 0 10*3/uL (ref 0.0–0.1)
Basophils Relative: 0.4 % (ref 0.0–3.0)
Eosinophils Absolute: 0.1 10*3/uL (ref 0.0–0.7)
Eosinophils Relative: 1.1 % (ref 0.0–5.0)
HCT: 40.2 % (ref 36.0–46.0)
Hemoglobin: 13.1 g/dL (ref 12.0–15.0)
Lymphocytes Relative: 41.7 % (ref 12.0–46.0)
Lymphs Abs: 3.2 10*3/uL (ref 0.7–4.0)
MCHC: 32.6 g/dL (ref 30.0–36.0)
MCV: 92.7 fl (ref 78.0–100.0)
Monocytes Absolute: 0.7 10*3/uL (ref 0.1–1.0)
Monocytes Relative: 8.8 % (ref 3.0–12.0)
Neutro Abs: 3.7 10*3/uL (ref 1.4–7.7)
Neutrophils Relative %: 48 % (ref 43.0–77.0)
Platelets: 338 10*3/uL (ref 150.0–400.0)
RBC: 4.33 Mil/uL (ref 3.87–5.11)
RDW: 14 % (ref 11.5–15.5)
WBC: 7.7 10*3/uL (ref 4.0–10.5)

## 2020-12-04 LAB — URIC ACID: Uric Acid, Serum: 3.7 mg/dL (ref 2.4–7.0)

## 2020-12-04 NOTE — Progress Notes (Signed)
Subjective:    Patient ID: Raven Weaver, female    DOB: 03-20-1960, 60 y.o.   MRN: [id number]  Chief Complaint  Patient presents with   Pain    Left ankle pain since Monday morning, swelling started that evening. Has been elevating foot while sitting    HPI Patient was seen today for acute concern.  Patient endorses pain in left Achilles tendon on Monday.  Patient notes waking up Tuesday morning with edema, mild erythema, and pain in left ankle.  Pain with walking.  Patient denies injury, increased activity, history of gout.  Patient states ankle feels better with elevation.  Does not recall eating anything different.  Had cabbage, spaghetti, oatmeal on Sunday/Monday prior to symptoms.  Patient denies fever, chills, SOB, calf pain. Pt typically exercises during the week but had not at the start of symptoms.  Past Medical History:  Diagnosis Date   Abnormal Pap smear of cervix    remote, > 25 years ago, s/p cone bxs, all normal since per her report   Diabetes mellitus without complication ([place])    Essential hypertension 04/08/2015   GERD (gastroesophageal reflux disease)    History of kidney stones    Hyperglycemia    gestational diabetes   Kidney stones    remote, > 30 years ago   PVC (premature ventricular contraction) 12/26/2014   SVT (supraventricular tachycardia) ([place]) 12/26/2014   Tobacco use     No Known Allergies  ROS General: Denies fever, chills, night sweats, changes in weight, changes in appetite HEENT: Denies headaches, ear pain, changes in vision, rhinorrhea, sore throat CV: Denies CP, palpitations, SOB, orthopnea Pulm: Denies SOB, cough, wheezing GI: Denies abdominal pain, nausea, vomiting, diarrhea, constipation GU: Denies dysuria, hematuria, frequency, vaginal discharge Msk: Denies muscle cramps, joint pains  +L ankle pain, edema Neuro: Denies weakness, numbness, tingling Skin: Denies rashes, bruising   Psych: Denies depression, anxiety,  hallucinations      Objective:    Blood pressure 138/76, pulse 62, temperature 98.5 F (36.9 C), temperature source Oral, weight 155 lb 3.2 oz (70.4 kg), last menstrual period 02/16/2006, SpO2 98 %.  Gen. Pleasant, well-nourished, in no distress, normal affect   HEENT: [place]/AT, face symmetric, conjunctiva clear, no scleral icterus, PERRLA, EOMI, nares patent without drainage Lungs: no accessory muscle use Cardiovascular: RRR, no peripheral edema Musculoskeletal: Mild TTP of insertion of left Achilles tendon at heel.  Mild TTP of ankle.  Pain with eversion and inversion of left ankle.  No cyanosis or clubbing, normal tone Neuro:  A&Ox3, CN II-XII intact, ambulating with limp Skin:  Warm, dry, intact.  Left medial ankle edema and mild erythema.   Wt Readings from Last 3 Encounters:  12/04/20 155 lb 3.2 oz (70.4 kg)  01/25/20 164 lb 11.2 oz (74.7 kg)  11/02/19 170 lb (77.1 kg)    Lab Results  Component Value Date   WBC 10.4 05/20/2017   HGB 13.9 05/20/2017   HCT 41.4 05/20/2017   PLT 356.0 05/20/2017   GLUCOSE 83 11/02/2019   CHOL 161 11/02/2019   TRIG 84 11/02/2019   HDL 52 11/02/2019   LDLCALC 92 11/02/2019   NA 142 11/02/2019   K 4.3 11/02/2019   CL 106 11/02/2019   CREATININE 0.74 11/02/2019   BUN 8 11/02/2019   CO2 28 11/02/2019   TSH 0.633 03/10/2017   HGBA1C 6.1 (H) 11/02/2019    Assessment/Plan:  Acute left ankle pain - Plan: Uric Acid, CBC with Differential/Platelet, DG Ankle Complete Left,  Basic metabolic panel, D-dimer, Quantitative  Edema of left ankle - Plan: Uric Acid, D-dimer, Quantitative  Discussed possible causes including gout, cellulitis, ankle sprain.  Must also consider blood clot.  Will obtain labs and imaging to further evaluate.  We will wrap left ankle after imaging and labs.  Discussed elevation, ice, rest, NSAIDs and other supportive care.  Further recommendations based on imaging and lab results.  F/u as needed  Grier Mitts, MD

## 2020-12-04 NOTE — Telephone Encounter (Signed)
Caller states yesterday she woke up with a pain at the back of her left leg. As the day went on she noticed swelling around her left foot but mostly the ankle. It is red as well. She is not able to walk on it d/t pain but is fine she it is elevated. No fever. 12/03/2020 8:48:15 AM See PCP within 24 Hours Yes [place] Lucia Gaskins, RN, [facility] [place] Caller Disagree/Comply Comply Caller Understands Yes PreDisposition Call Doctor  Pt has appt with PCP today (12/04/20). States there is no change.

## 2020-12-05 LAB — D-DIMER, QUANTITATIVE: D-Dimer, Quant: 0.86 mcg/mL FEU — ABNORMAL HIGH (ref <0.50)

## 2020-12-06 ENCOUNTER — Other Ambulatory Visit: Payer: Self-pay | Admitting: Family Medicine

## 2020-12-06 DIAGNOSIS — M25572 Pain in left ankle and joints of left foot: Secondary | ICD-10-CM

## 2020-12-06 DIAGNOSIS — R7989 Other specified abnormal findings of blood chemistry: Secondary | ICD-10-CM

## 2020-12-06 DIAGNOSIS — M25472 Effusion, left ankle: Secondary | ICD-10-CM

## 2020-12-08 ENCOUNTER — Other Ambulatory Visit: Payer: Self-pay | Admitting: Family Medicine

## 2020-12-08 DIAGNOSIS — I10 Essential (primary) hypertension: Secondary | ICD-10-CM

## 2020-12-11 ENCOUNTER — Telehealth: Payer: Self-pay | Admitting: Family Medicine

## 2020-12-11 NOTE — Telephone Encounter (Signed)
Called patient to let her know about [facility] calling her, and to give her the number, pt stated she was on the other line with them.

## 2020-12-11 NOTE — Telephone Encounter (Signed)
Damita Dunnings is unable to reach the patient and knows this test is important so is reaching out to [place] to inform.  Patient can call Damita Dunnings directly and make the appointment once we reach the patient.

## 2021-02-09 ENCOUNTER — Other Ambulatory Visit: Payer: Self-pay | Admitting: Family Medicine

## 2021-02-09 DIAGNOSIS — I10 Essential (primary) hypertension: Secondary | ICD-10-CM

## 2021-03-11 ENCOUNTER — Ambulatory Visit: Payer: Self-pay | Admitting: *Deleted

## 2021-03-11 ENCOUNTER — Other Ambulatory Visit: Payer: Self-pay

## 2021-03-11 VITALS — BP 130/84

## 2021-03-11 DIAGNOSIS — Z1239 Encounter for other screening for malignant neoplasm of breast: Secondary | ICD-10-CM

## 2021-03-11 DIAGNOSIS — Z1211 Encounter for screening for malignant neoplasm of colon: Secondary | ICD-10-CM

## 2021-03-11 NOTE — Progress Notes (Signed)
Raven Weaver is a 61 y.o. female who presents to [facility] clinic today with no complaints. Patient had a right breast ultrasound completed 09/25/2020 that a bilateral diagnostic mammogram and right breast ultrasound was recommended in 34-months for follow-up.   Pap Smear: Pap smear not completed today. Last Pap smear was 01/25/2020 at [facility] clinic and was abnormal - ASCUS with negative HPV . Per patient has history of an abnormal Pap smear 36-41 years ago that a colposcopy was completed for follow-up. Patient stated all Pap smears have been normal since colposcopy prior to her most recent Pap smear and has had more than three normal Pap smears. Last Pap smear result is available in Epic.   Physical exam: Breasts Left breast larger than right breast that per patient is normal for her. No skin abnormalities bilateral breasts. No nipple retraction bilateral breasts. No nipple discharge bilateral breasts. No lymphadenopathy. No lumps palpated bilateral breasts. No complaints of pain or tenderness on exam.    MS DIGITAL SCREENING TOMO BILATERAL  Result Date: 02/07/2020 CLINICAL DATA:  Screening. EXAM: DIGITAL SCREENING BILATERAL MAMMOGRAM WITH TOMO AND CAD COMPARISON:  None. ACR Breast Density Category c: The breast tissue is heterogeneously dense, which may obscure small masses. FINDINGS: In the right breast, a possible mass warrants further evaluation. In the left breast, a possible mass warrants further evaluation images were processed with CAD. IMPRESSION: Further evaluation is suggested for possible masses in the right and left breasts. RECOMMENDATION: Diagnostic mammogram and possibly ultrasound of the right and left breasts. (Code:[id number]) The patient will be contacted regarding the findings, and additional imaging will be scheduled. BI-RADS CATEGORY  0: Incomplete. Need additional imaging evaluation and/or prior mammograms for comparison. Electronically Signed   By: Lajean Manes M.D.   On:  02/07/2020 14:56   MS DIGITAL DIAG TOMO BILAT  Result Date: 03/25/2020 CLINICAL DATA:  Screening recall for possible masses in each breast. EXAM: DIGITAL DIAGNOSTIC BILATERAL MAMMOGRAM WITH TOMOSYNTHESIS AND CAD; ULTRASOUND RIGHT BREAST LIMITED TECHNIQUE: Bilateral digital diagnostic mammography and breast tomosynthesis was performed. The images were evaluated with computer-aided detection.; Targeted ultrasound examination of the right breast was performed. COMPARISON:  Screening exam, 01/25/2020. No older studies available. ACR Breast Density Category c: The breast tissue is heterogeneously dense, which may obscure small masses. FINDINGS: On the left, the possible mass noted on the current screening study disperses consistent with superimposed normal fibroglandular tissue. There is no underlying asymmetry and there are no areas of architectural distortion. There are no suspicious calcifications. On the right, the possible mass noted in the lower inner right breast on the current screening study persists as a 6 mm oval mostly circumscribed mass. On physical exam, no mass is palpated in the right breast. Targeted right breast ultrasound is performed, showing a oval, mostly anechoic and mostly circumscribed mass in the right breast at 4 o'clock, 4 cm the nipple, measuring 7 x 5 x 5 mm. There is some increased through transmission of the sound beam and no internal blood flow on color Doppler analysis. IMPRESSION: 1. Probably benign 7 mm mass in the 4 o'clock position of the right breast, most likely a cyst. Short-term follow-up recommended. RECOMMENDATION: Right breast ultrasound in 6 months to reassess the 4 o'clock position 7 mm probably benign mass. I have discussed the findings and recommendations with the patient. If applicable, a reminder letter will be sent to the patient regarding the next appointment. BI-RADS CATEGORY  3: Probably benign. Electronically Signed   By: Shanon Brow  Ormond M.D.   On: 03/25/2020 12:27      Pelvic/Bimanual Pap is not indicated today per BCCCP guidelines.   Smoking History: Patient is a former smoker that quit in 2016.   Patient Navigation: Patient education provided. Access to services provided for patient through [place] program.   Colorectal Cancer Screening: Per patient has never had colonoscopy completed. FIT Test given to patient to complete. No complaints today.    Breast and Cervical Cancer Risk Assessment: Patient does not have family history of breast cancer, known genetic mutations, or radiation treatment to the chest before age 46. Per patient has history of cervical dysplasia. Patient has no history of being immunocompromised or DES exposure in-utero.  Risk Assessment     Risk Scores       03/11/2021 01/25/2020   Last edited by: Raven Revel, LPN Raven Weaver, CMA   5-year risk: 1.5 % 1.4 %   Lifetime risk: 6.5 % 6.9 %            A: BCCCP exam without pap smear No complaints.  P: Referred patient to the [facility] for a diagnostic mammogram per recommendation. Appointment scheduled Friday, March 28, 2021 at 1000. Let patient know that appointment needs to be scheduled at 47-months and a day for BCCCP to cover and that it needs to be rescheduled. Have called and sent message to the Breast Center to reschedule.  Raven Parish, RN 03/11/2021 10:48 AM

## 2021-03-11 NOTE — Patient Instructions (Signed)
Explained breast self awareness with Janean Sark. Patient did not need a Pap smear today due to last Pap smear and HPV typing was 01/25/2020. Let her know that her next Pap smear will be due in 3 years due to her last Pap smear was ASCUS with negative HPV. Referred patient to the [facility] for a diagnostic mammogram per recommendation. Appointment scheduled Friday, March 28, 2021 at 1000. Let patient know that appointment needs to be scheduled at 37-months and a day for BCCCP to cover and that it needs to be rescheduled. Janean Sark verbalized understanding.  Anntionette Madkins, Arvil Chaco, RN 10:48 AM

## 2021-03-17 ENCOUNTER — Other Ambulatory Visit: Payer: Self-pay | Admitting: Family Medicine

## 2021-03-17 DIAGNOSIS — I10 Essential (primary) hypertension: Secondary | ICD-10-CM

## 2021-03-26 ENCOUNTER — Other Ambulatory Visit: Payer: Self-pay

## 2021-03-26 DIAGNOSIS — E1169 Type 2 diabetes mellitus with other specified complication: Secondary | ICD-10-CM

## 2021-03-26 MED ORDER — METFORMIN HCL 1000 MG PO TABS: 1000.0000 mg | ORAL_TABLET | Freq: Two times a day (BID) | ORAL | 1 refills | Status: DC

## 2021-03-28 ENCOUNTER — Other Ambulatory Visit: Payer: Self-pay

## 2021-04-01 ENCOUNTER — Other Ambulatory Visit: Payer: Self-pay

## 2021-04-01 ENCOUNTER — Ambulatory Visit
Admission: RE | Admit: 2021-04-01 | Discharge: 2021-04-01 | Disposition: A | Discharge status: Home / Self Care | Payer: No Typology Code available for payment source | Source: Ambulatory Visit | Attending: Obstetrics and Gynecology | Admitting: Obstetrics and Gynecology

## 2021-04-01 ENCOUNTER — Ambulatory Visit
Admission: RE | Admit: 2021-04-01 | Discharge: 2021-04-01 | Disposition: A | Discharge status: Home / Self Care | Payer: Self-pay | Source: Ambulatory Visit | Attending: Obstetrics and Gynecology | Admitting: Obstetrics and Gynecology

## 2021-04-01 DIAGNOSIS — N6310 Unspecified lump in the right breast, unspecified quadrant: Secondary | ICD-10-CM

## 2021-04-04 ENCOUNTER — Encounter: Payer: Self-pay | Admitting: Family Medicine

## 2021-05-09 ENCOUNTER — Encounter: Payer: Self-pay | Admitting: Family Medicine

## 2021-05-09 ENCOUNTER — Ambulatory Visit (INDEPENDENT_AMBULATORY_CARE_PROVIDER_SITE_OTHER): Payer: Self-pay | Admitting: Family Medicine

## 2021-05-09 VITALS — BP 131/85 | HR 75 | Temp 98.7°F | Ht 64.0 in | Wt 154.0 lb

## 2021-05-09 DIAGNOSIS — I10 Essential (primary) hypertension: Secondary | ICD-10-CM

## 2021-05-09 DIAGNOSIS — E1169 Type 2 diabetes mellitus with other specified complication: Secondary | ICD-10-CM

## 2021-05-09 DIAGNOSIS — Z0000 Encounter for general adult medical examination without abnormal findings: Secondary | ICD-10-CM

## 2021-05-09 DIAGNOSIS — R012 Other cardiac sounds: Secondary | ICD-10-CM

## 2021-05-09 DIAGNOSIS — K029 Dental caries, unspecified: Secondary | ICD-10-CM

## 2021-05-09 LAB — LIPID PANEL
Cholesterol: 146 mg/dL (ref 0–200)
HDL: 52.1 mg/dL (ref >39.00)
LDL Cholesterol: 79 mg/dL (ref 0–99)
NonHDL: 94.2
Total CHOL/HDL Ratio: 3
Triglycerides: 75 mg/dL (ref 0.0–149.0)
VLDL: 15 mg/dL (ref 0.0–40.0)

## 2021-05-09 LAB — CBC WITH DIFFERENTIAL/PLATELET
Basophils Absolute: 0 10*3/uL (ref 0.0–0.1)
Basophils Relative: 0.3 % (ref 0.0–3.0)
Eosinophils Absolute: 0.1 10*3/uL (ref 0.0–0.7)
Eosinophils Relative: 1.3 % (ref 0.0–5.0)
HCT: 42.9 % (ref 36.0–46.0)
Hemoglobin: 14.1 g/dL (ref 12.0–15.0)
Lymphocytes Relative: 32.5 % (ref 12.0–46.0)
Lymphs Abs: 3.6 10*3/uL (ref 0.7–4.0)
MCHC: 32.8 g/dL (ref 30.0–36.0)
MCV: 91.8 fl (ref 78.0–100.0)
Monocytes Absolute: 0.7 10*3/uL (ref 0.1–1.0)
Monocytes Relative: 6.2 % (ref 3.0–12.0)
Neutro Abs: 6.6 10*3/uL (ref 1.4–7.7)
Neutrophils Relative %: 59.7 % (ref 43.0–77.0)
Platelets: 333 10*3/uL (ref 150.0–400.0)
RBC: 4.67 Mil/uL (ref 3.87–5.11)
RDW: 13.8 % (ref 11.5–15.5)
WBC: 11 10*3/uL — ABNORMAL HIGH (ref 4.0–10.5)

## 2021-05-09 LAB — HEMOGLOBIN A1C: Hgb A1c MFr Bld: 5.9 % (ref 4.6–6.5)

## 2021-05-09 LAB — COMPREHENSIVE METABOLIC PANEL
ALT: 8 U/L (ref 0–35)
AST: 17 U/L (ref 0–37)
Albumin: 4.4 g/dL (ref 3.5–5.2)
Alkaline Phosphatase: 65 U/L (ref 39–117)
BUN: 13 mg/dL (ref 6–23)
CO2: 31 mEq/L (ref 19–32)
Calcium: 9.9 mg/dL (ref 8.4–10.5)
Chloride: 106 mEq/L (ref 96–112)
Creatinine, Ser: 0.82 mg/dL (ref 0.40–1.20)
GFR: 76.78 mL/min (ref >60.00)
Glucose, Bld: 90 mg/dL (ref 70–99)
Potassium: 4.6 mEq/L (ref 3.5–5.1)
Sodium: 142 mEq/L (ref 135–145)
Total Bilirubin: 0.4 mg/dL (ref 0.2–1.2)
Total Protein: 7.3 g/dL (ref 6.0–8.3)

## 2021-05-09 MED ORDER — METFORMIN HCL 1000 MG PO TABS: 1000.0000 mg | ORAL_TABLET | Freq: Two times a day (BID) | ORAL | 3 refills | Status: DC

## 2021-05-09 MED ORDER — LISINOPRIL 10 MG PO TABS: 10.0000 mg | ORAL_TABLET | Freq: Every day | ORAL | 3 refills | Status: DC

## 2021-05-09 MED ORDER — METOPROLOL TARTRATE 50 MG PO TABS: 50.0000 mg | ORAL_TABLET | Freq: Two times a day (BID) | ORAL | 3 refills | Status: DC

## 2021-05-09 NOTE — Progress Notes (Signed)
Subjective:  ?  ? Raven Weaver is a 61 y.o. female and is here for a comprehensive physical exam.  Patient currently without insurance.  Pt is taking care of her mother and not working at this time.  Patient states BP at home typically 120/72, 126/71, 138/66, 127/75 on lisinopril and metoprolol.  Taking metformin 1000 mg twice daily without issue.  Patient occasionally walks for exercise.  Pt is not eating fast food and trying to increase her water intake.  Had mammogram on 04/01/2021.  Patient received Cologuard, but has not needed it. ? ?Social History  ? ?Socioeconomic History  ? Marital status: Divorced  ?  Spouse name: Not on file  ? Number of children: Not on file  ? Years of education: Not on file  ? Highest education level: Not on file  ?Occupational History  ? Not on file  ?Tobacco Use  ? Smoking status: Former  ?  Packs/day: 0.25  ?  Types: Cigarettes  ?  Quit date: 06/25/2014  ?  Years since quitting: 6.8  ? Smokeless tobacco: Never  ? Tobacco comments:  ?  per patient quit 8 weeks ago  ?Vaping Use  ? Vaping Use: Never used  ?Substance and Sexual Activity  ? Alcohol use: No  ?  Alcohol/week: 0.0 standard drinks  ?  Comment: 1 glass every 2 weeks  ? Drug use: No  ? Sexual activity: Not Currently  ?  Birth control/protection: Post-menopausal  ?Other Topics Concern  ? Not on file  ?Social History Narrative  ? Work or School: full time for [employer]  ?   ? Home Situation: lives with mom and kids home during the summer  ?   ? Spiritual Beliefs: Christian  ?   ? Lifestyle: walks 2-3 days per week; diet is fair per her report - avoid fried foods - tries to eat veggies and lean meat  ?   ? ?Social Determinants of Health  ? ?Financial Resource Strain: Not on file  ?Food Insecurity: No Food Insecurity  ? Worried About Charity fundraiser in the Last Year: Never true  ? Ran Out of Food in the Last Year: Never true  ?Transportation Needs: No Transportation Needs  ? Lack of Transportation (Medical): No  ? Lack of  Transportation (Non-Medical): No  ?Physical Activity: Not on file  ?Stress: Not on file  ?Social Connections: Not on file  ?Intimate Partner Violence: Not on file  ? ?Health Maintenance  ?Topic Date Due  ? COLONOSCOPY (Pts 45-82yr Insurance coverage will need to be confirmed)  Never done  ? Zoster Vaccines- Shingrix (1 of 2) Never done  ? OPHTHALMOLOGY EXAM  03/20/2016  ? FOOT EXAM  08/11/2016  ? COVID-19 Vaccine (4 - Booster for Moderna series) 03/21/2020  ? HEMOGLOBIN A1C  05/01/2020  ? INFLUENZA VACCINE  05/16/2021 (Originally 09/16/2020)  ? PAP SMEAR-Modifier  01/25/2023  ? MAMMOGRAM  04/02/2023  ? TETANUS/TDAP  08/06/2024  ? HIV Screening  Completed  ? Hepatitis C Screening  Addressed  ? HPV VACCINES  Aged Out  ? ? ?The following portions of the patient's history were reviewed and updated as appropriate: allergies, current medications, past family history, past medical history, past social history, past surgical history, and problem list. ? ?Review of Systems ?Pertinent items noted in HPI and remainder of comprehensive ROS otherwise negative.  ? ?Objective:  ? ? BP 131/85 (BP Location: Left Arm, Patient Position: Sitting, Cuff Size: Normal)   Pulse 75  Temp 98.7 ?F (37.1 ?C) (Oral)   Ht '5\' 4"'$  (1.626 m)   Wt 154 lb (69.9 kg)   LMP 02/16/2006   SpO2 99%   BMI 26.43 kg/m?  ?General appearance: alert, cooperative, and no distress ?Head: Normocephalic, without obvious abnormality, atraumatic ?Eyes: conjunctivae/corneas clear. PERRL, EOM's intact. Fundi benign. ?Ears: normal TM's and external ear canals both ears ?Nose: Nares normal. Septum midline. Mucosa normal. No drainage or sinus tenderness. ?Throat: lips, mucosa, and tongue normal; and gums normal. Dental carries, ?Neck: no adenopathy, no carotid bruit, no JVD, supple, symmetrical, trachea midline, and thyroid not enlarged, symmetric, no tenderness/mass/nodules ?Lungs: clear to auscultation bilaterally ?Heart: regular irregular rhythm noted on exam,  S1,S2, and S3.  no murmur, click, rub. ?Abdomen: soft, non-tender; bowel sounds normal; no masses,  no organomegaly ?Extremities: extremities normal, atraumatic, no cyanosis or edema ?Pulses: 2+ and symmetric ?Skin: Skin color, texture, turgor normal. No rashes or lesions ?Lymph nodes: Cervical, supraclavicular, and axillary nodes normal. ?Neurologic: Alert and oriented X 3, normal strength and tone. Normal symmetric reflexes. Normal coordination and gait  ?  ?Assessment:  ? ? Healthy female exam with extra heart sound on exam, HTN, and DM   ?  ?Plan:  ? ? Anticipatory guidance given including wearing seatbelts, smoke detectors in the home, increasing physical activity, increasing p.o. intake of water and vegetables. ?-labs ?-Mammogram done 04/01/2021 ?-Patient encouraged to complete Cologuard ?-Patient encouraged to schedule eye exam.  Given a list of area resources including upcoming community hearing and vision screen. ?-Discussed immunizations.  Patient hesitant 2/2 cost is currently without insurance. ?-Given information on area resources for underinsured/uninsured patients. ?-Given handout ?-Next CPE in 1 year ?See After Visit Summary for Counseling Recommendations  ? ?Essential hypertension  ?-controlled ?-continue current meds including lisinopril 10 mg, and metoprolol tartrate 50 mg twice daily ?-Lifestyle modifications ?-Continue checking BP at home ?- Plan: CMP, lisinopril (ZESTRIL) 10 MG tablet, metoprolol tartrate (LOPRESSOR) 50 MG tablet ? ?Type 2 diabetes mellitus with other specified complication, without long-term current use of insulin ([place])  ?-Hemoglobin A1c 6.1% on 11/02/2019 ?-Continue lifestyle modifications ?-Continue metformin 1000 mg twice daily ?-continue ACE-I ?-Obtain microalbumin ratio, foot exam, diabetic retinopathy screen. ?-Consider starting statin. ?- Plan: Hemoglobin A1c, Lipid panel, metFORMIN (GLUCOPHAGE) 1000 MG tablet ? ?Abnormal heart sounds ?-Extra heart sound after S2 (lub,  dub, dub) heard on exam.   ?-EKG with NSR, HR low 60s, LVH by criteria, possible LA enlargement given negative terminal P wave in V1.  EKG for this visit similar to EKG from 03/10/2017. ?-Continue to monitor ?-Consider echo as last done 03/10/2017 ?-Patient encouraged to follow-up with cardiology however cost a factor. ? - Plan: EKG 12-Lead, CBC with Differential/Platelet, TSH, T4, Free, CMP ? ?Dental caries ?-Given handout on local dental providers for uninsured or underinsured patients ? ?F/u in 4-6 months, sooner if needed ? ?Grier Mitts, MD ? ?

## 2021-05-13 LAB — TSH: TSH: 1.24 u[IU]/mL (ref 0.35–5.50)

## 2021-05-13 LAB — T4, FREE: Free T4: 1.07 ng/dL (ref 0.60–1.60)

## 2021-09-11 ENCOUNTER — Telehealth: Payer: Self-pay | Admitting: Cardiovascular Disease

## 2021-09-12 ENCOUNTER — Telehealth (HOSPITAL_BASED_OUTPATIENT_CLINIC_OR_DEPARTMENT_OTHER): Payer: Self-pay | Admitting: Cardiovascular Disease

## 2021-09-12 NOTE — Telephone Encounter (Signed)
Rx(s) sent to pharmacy electronically.  

## 2021-09-12 NOTE — Telephone Encounter (Signed)
Called patient to schedule a New Patient appointment with Dr. [name]. Left a v/m to call and schedule.

## 2021-09-12 NOTE — Telephone Encounter (Signed)
*  STAT* If patient is at the pharmacy, call can be transferred to refill team.   1. Which medications need to be refilled? (please list name of each medication and dose if known)     nitroGLYCERIN (NITROSTAT) 0.4 MG SL tablet  2. Which pharmacy/location (including street and city if local pharmacy) is medication to be sent to?  [facility], [place].  3. Do they need a 30 day or 90 day supply?   30 days  Patient is completely out of this medication.  Patient has appointment scheduled on 10/06/21.

## 2021-09-17 IMAGING — MG DIGITAL DIAGNOSTIC BILAT W/ TOMO W/ CAD
6 of 10 series · 6 of 30 positions shown · non-contrast
Comparison: Screening exam, 01/25/2020.

CLINICAL DATA: Screening recall for possible masses in each breast.

EXAM:
DIGITAL DIAGNOSTIC BILATERAL MAMMOGRAM WITH TOMOSYNTHESIS AND CAD;
ULTRASOUND RIGHT BREAST LIMITED
TECHNIQUE: Bilateral digital diagnostic mammography and breast tomosynthesis
was performed. The images were evaluated with computer-aided
detection.; Targeted ultrasound examination of the right breast was
performed.

[R MLO synth-2D (1 of 2)]
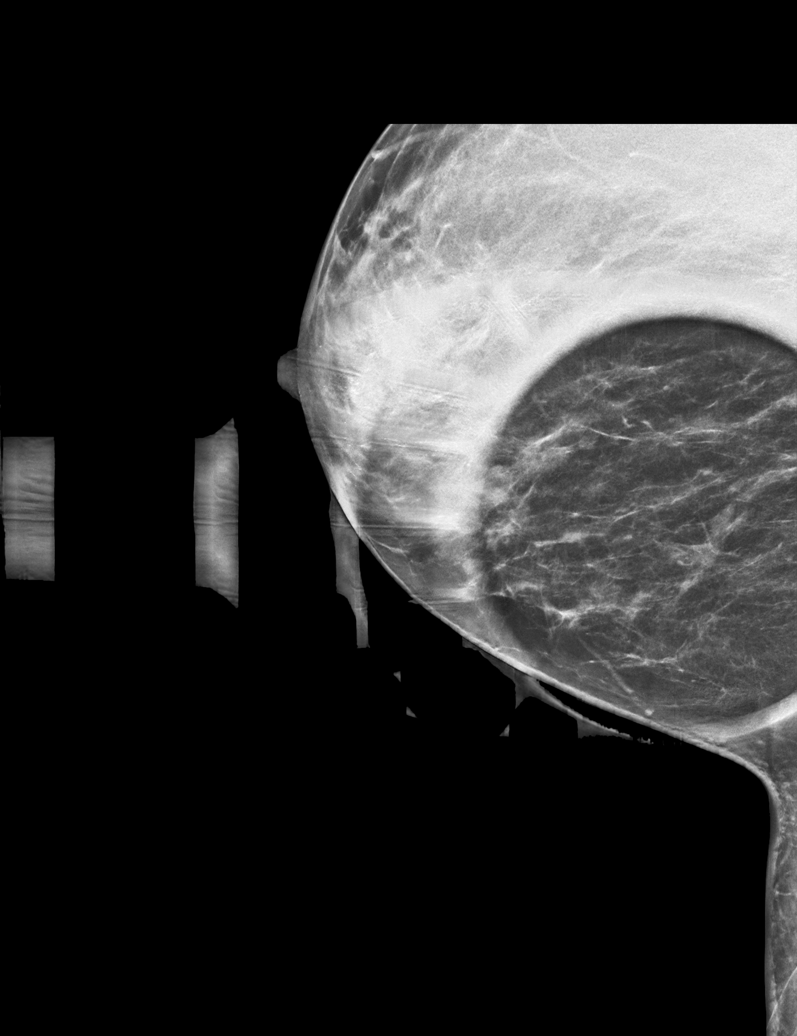

[L MLO synth-2D]
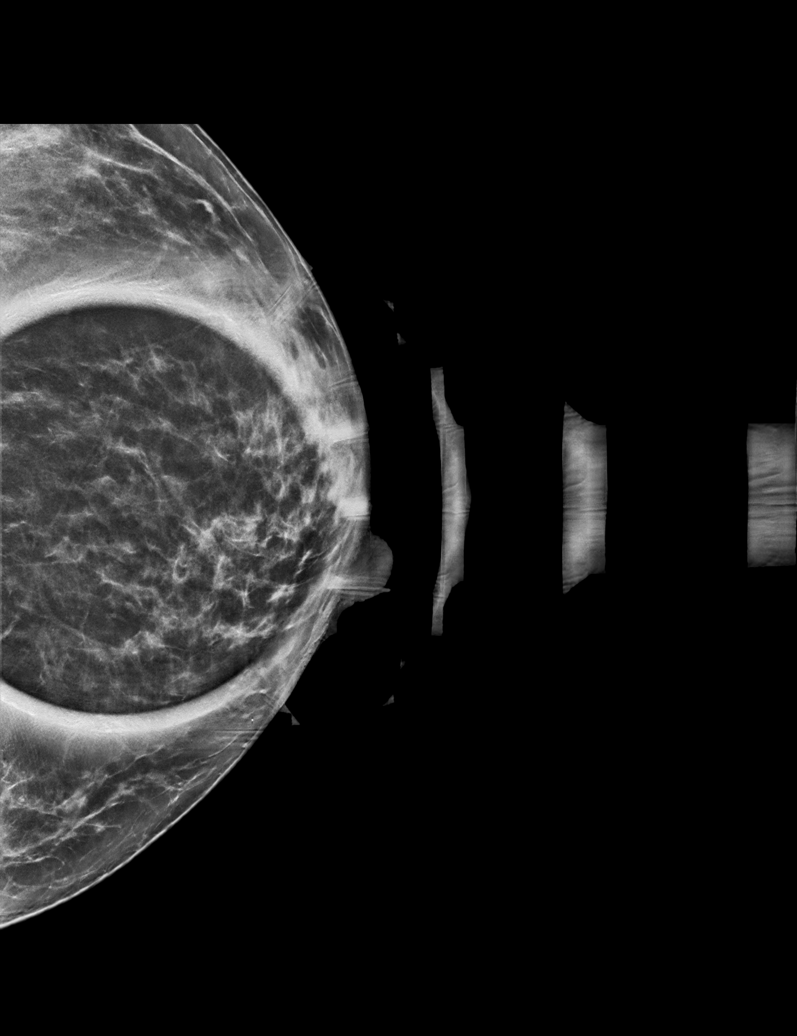

[R MLO synth-2D (2 of 2)]
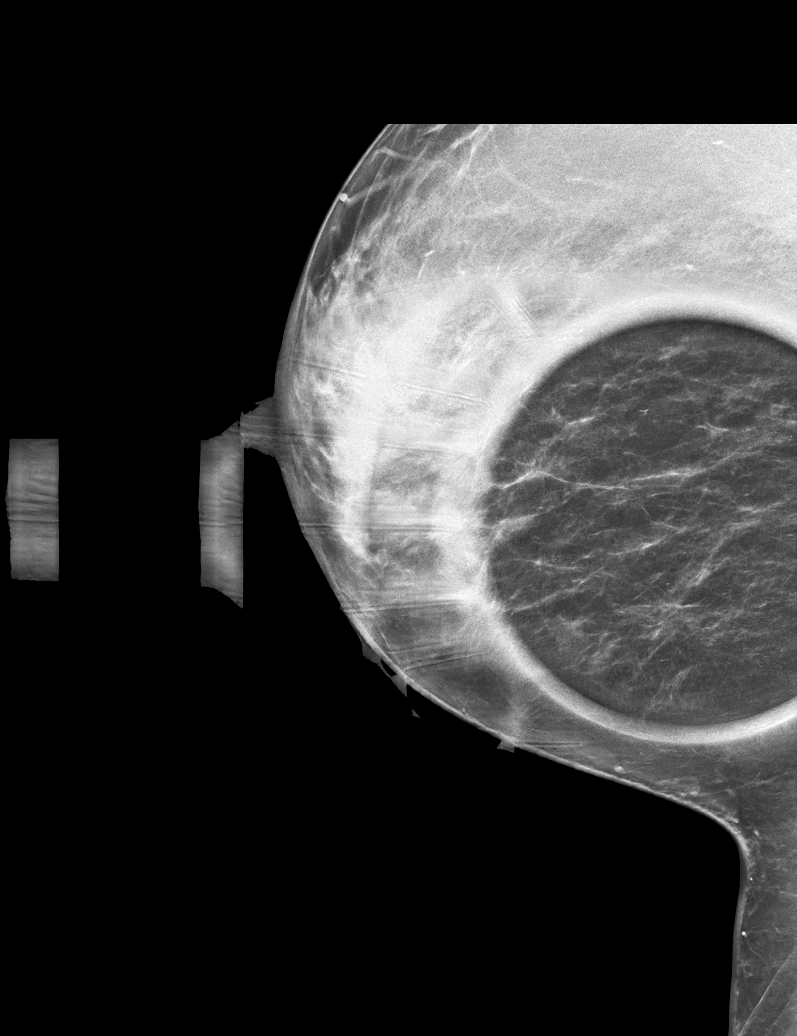

[L CC synth-2D]
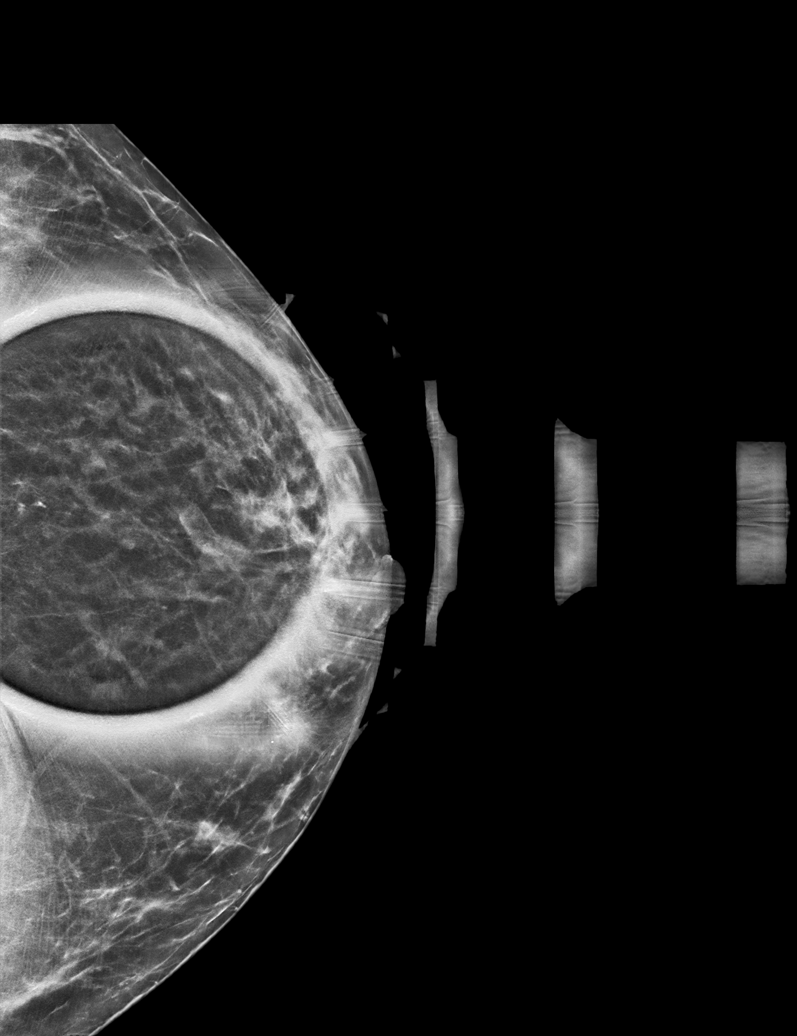

[R CC synth-2D]
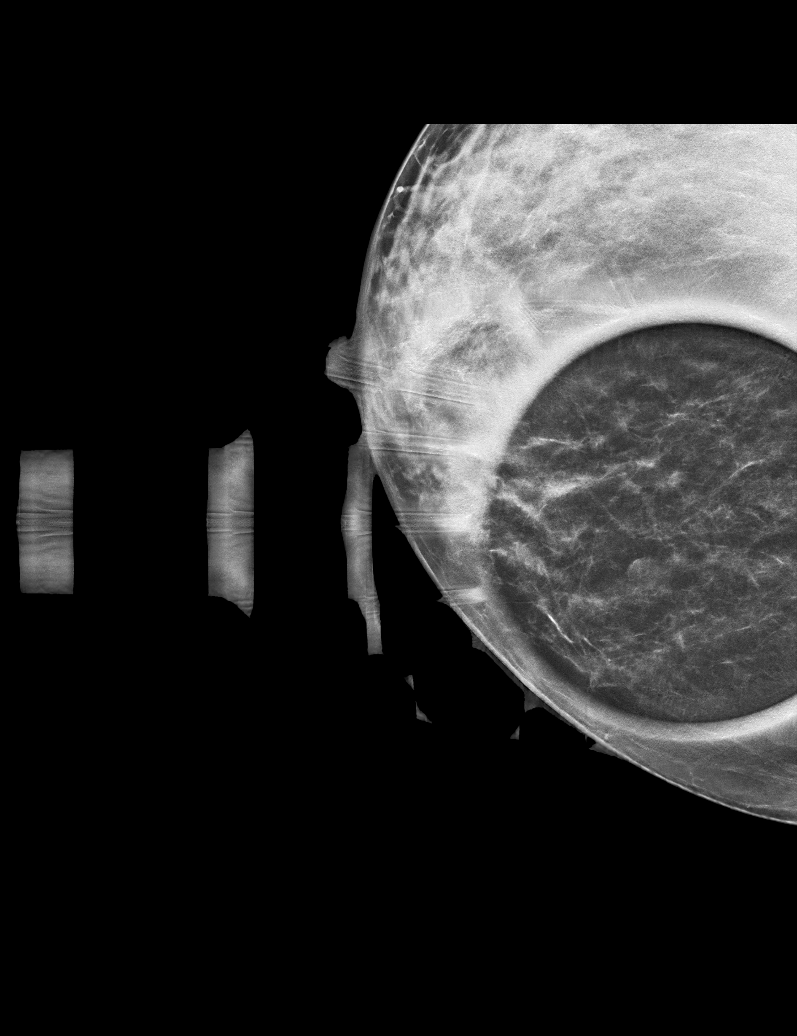

[R CC tomo · tomo slice 25/50.0]
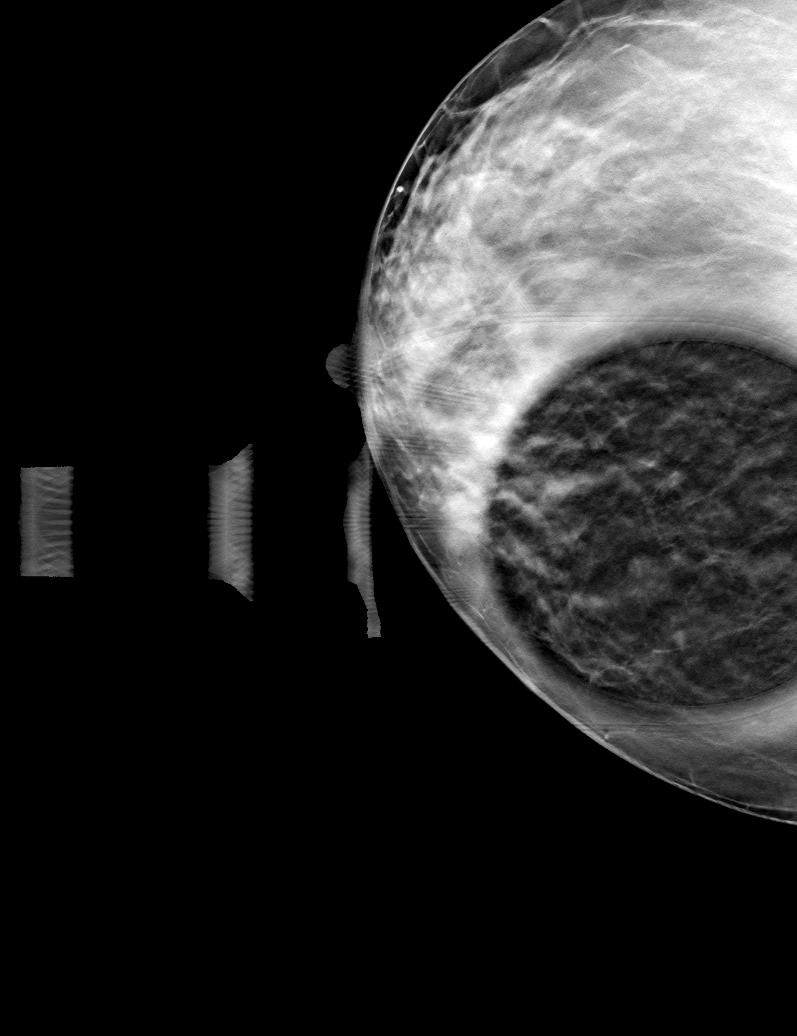

[6 of 30 positions shown; findings below may reference images not displayed]

No older studies available.

ACR Breast Density Category c: The breast tissue is heterogeneously
dense, which may obscure small masses.
FINDINGS: On the left, the possible mass noted on the current screening study
disperses consistent with superimposed normal fibroglandular tissue.
There is no underlying asymmetry and there are no areas of
architectural distortion. There are no suspicious calcifications.

On the right, the possible mass noted in the lower inner right
breast on the current screening study persists as a 6 mm oval mostly
circumscribed mass.

On physical exam, no mass is palpated in the right breast.

Targeted right breast ultrasound is performed, showing a oval,
mostly anechoic and mostly circumscribed mass in the right breast at
4 o'clock, 4 cm the nipple, measuring 7 x 5 x 5 mm. There is some
increased through transmission of the sound beam and no internal
blood flow on color Doppler analysis.
IMPRESSION: 1. Probably benign 7 mm mass in the 4 o'clock position of the right
breast, most likely a cyst. Short-term follow-up recommended.

RECOMMENDATION:
Right breast ultrasound in 6 months to reassess the 4 o'clock
position 7 mm probably benign mass.

I have discussed the findings and recommendations with the patient.
If applicable, a reminder letter will be sent to the patient
regarding the next appointment.

BI-RADS CATEGORY  3: Probably benign.

## 2021-09-17 IMAGING — US US BREAST*R* LIMITED INC AXILLA
1 series · 6 of 6 positions shown · non-contrast
Comparison: Screening exam, 01/25/2020.

CLINICAL DATA: Screening recall for possible masses in each breast.

EXAM:
DIGITAL DIAGNOSTIC BILATERAL MAMMOGRAM WITH TOMOSYNTHESIS AND CAD;
ULTRASOUND RIGHT BREAST LIMITED
TECHNIQUE: Bilateral digital diagnostic mammography and breast tomosynthesis
was performed. The images were evaluated with computer-aided
detection.; Targeted ultrasound examination of the right breast was
performed.

[Series 1: us breast*right* limited inc axilla · 0.05mm/px · 6 of 6 slices shown]
[im 1/6]
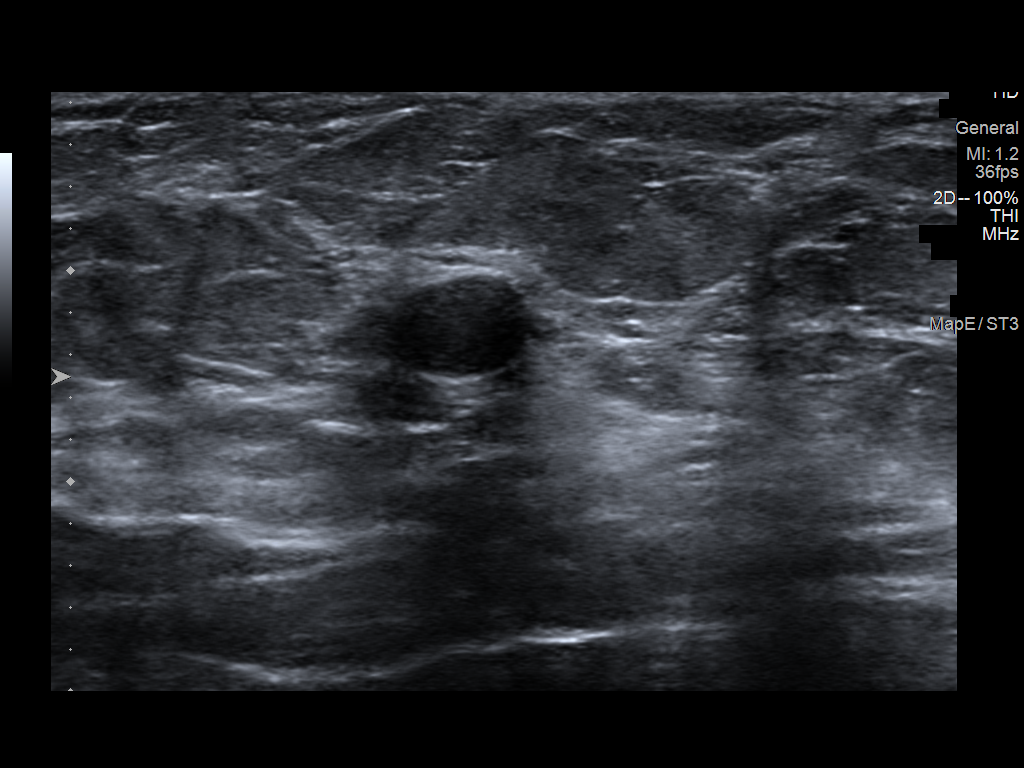
[im 2/6]
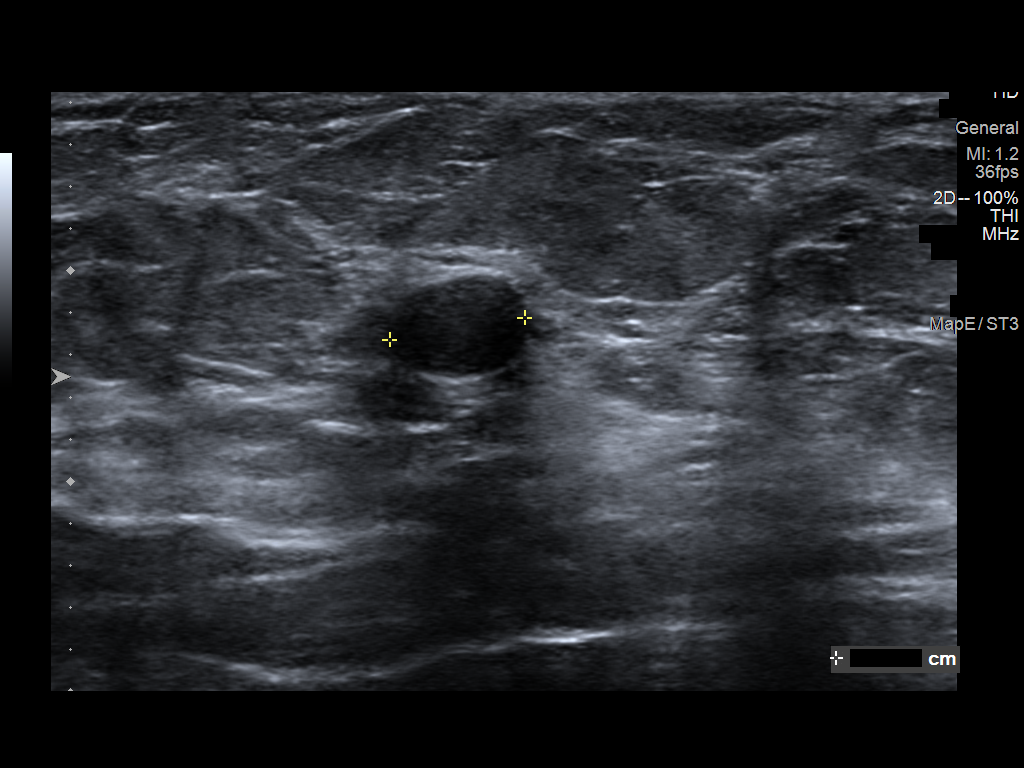
[im 3/6]
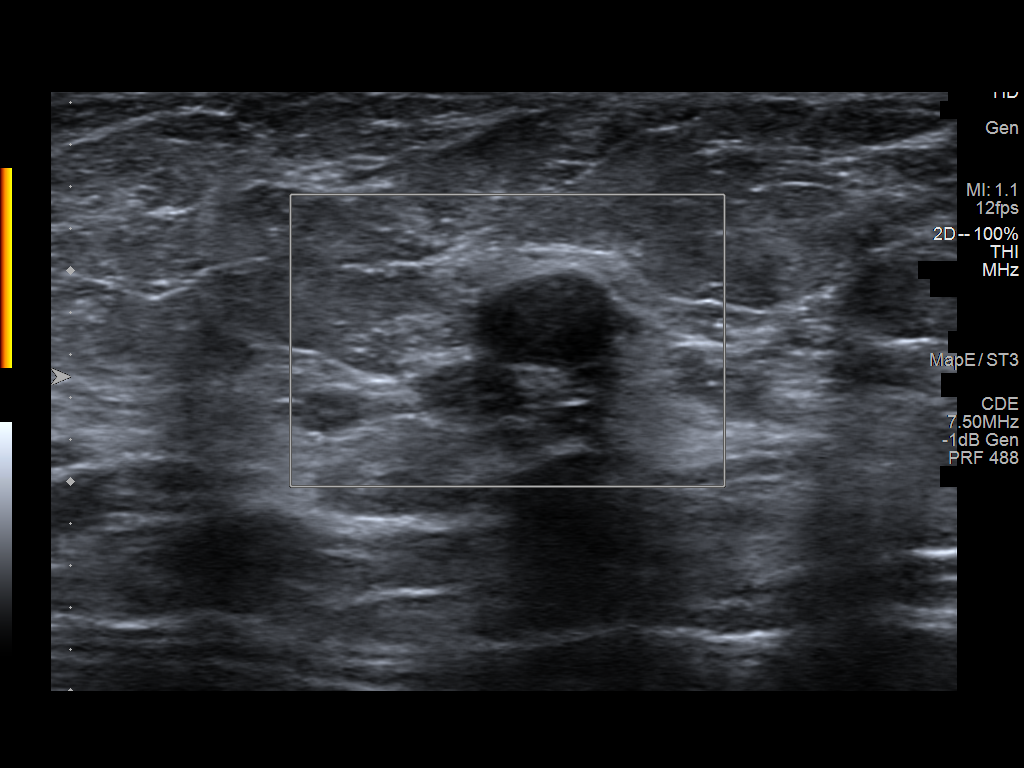
[im 4/6]
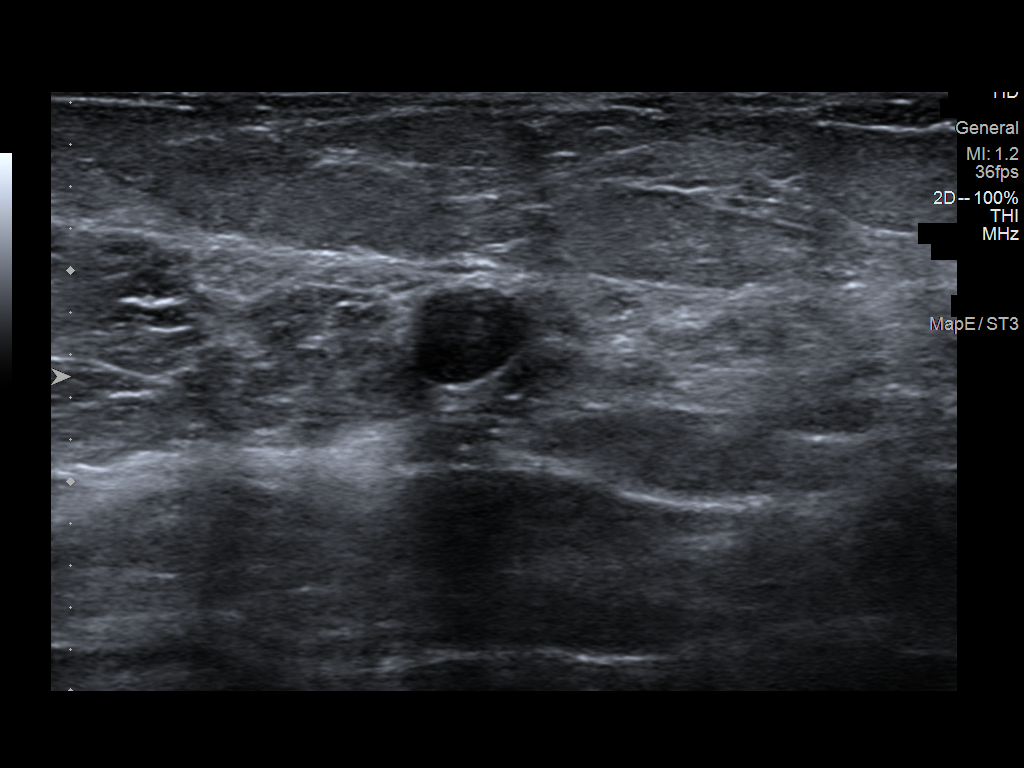
[im 5/6]
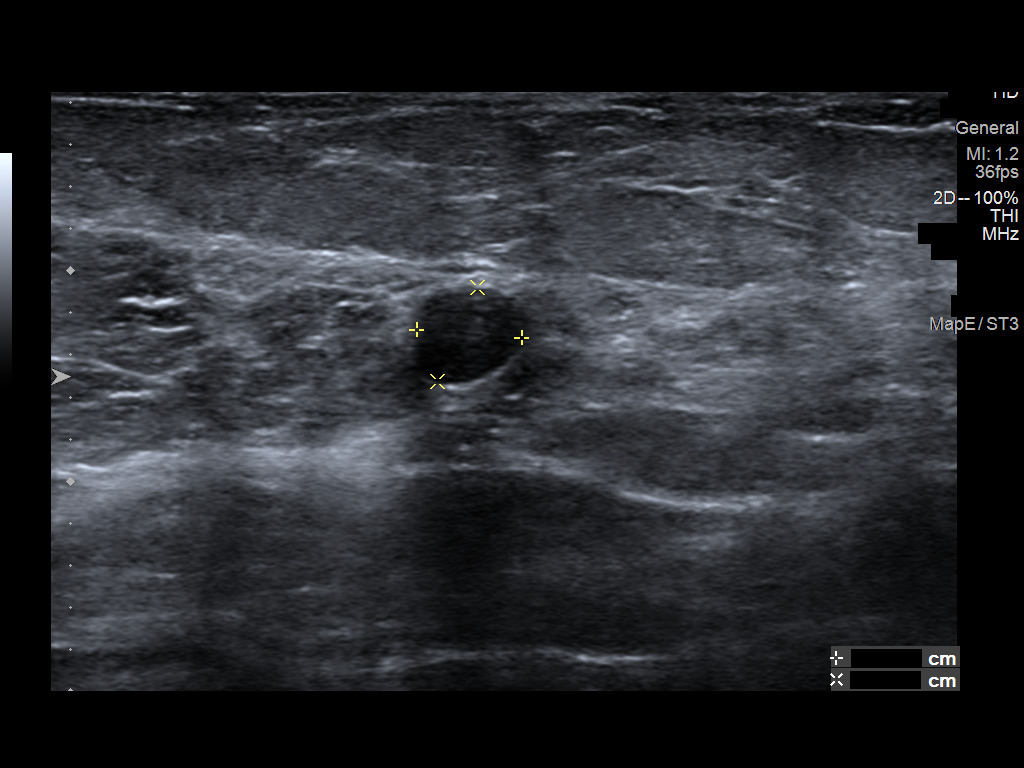
[im 6/6]
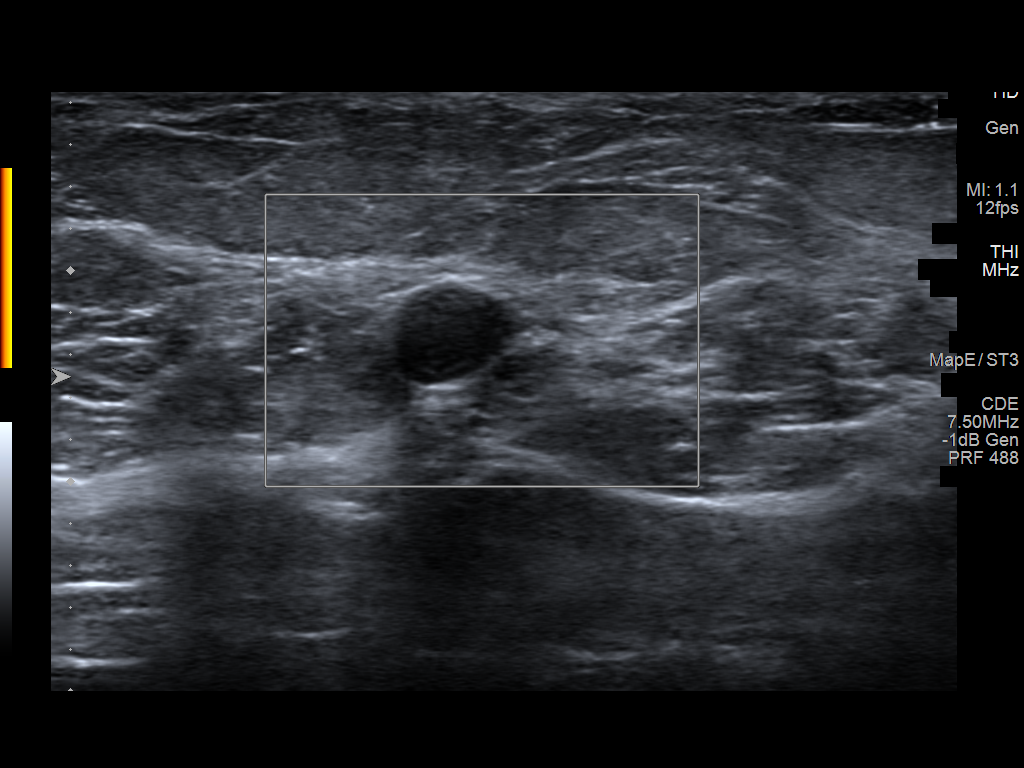

[6 of 6 positions shown; findings below may reference images not displayed]

No older studies available.

ACR Breast Density Category c: The breast tissue is heterogeneously
dense, which may obscure small masses.
FINDINGS: On the left, the possible mass noted on the current screening study
disperses consistent with superimposed normal fibroglandular tissue.
There is no underlying asymmetry and there are no areas of
architectural distortion. There are no suspicious calcifications.

On the right, the possible mass noted in the lower inner right
breast on the current screening study persists as a 6 mm oval mostly
circumscribed mass.

On physical exam, no mass is palpated in the right breast.

Targeted right breast ultrasound is performed, showing a oval,
mostly anechoic and mostly circumscribed mass in the right breast at
4 o'clock, 4 cm the nipple, measuring 7 x 5 x 5 mm. There is some
increased through transmission of the sound beam and no internal
blood flow on color Doppler analysis.
IMPRESSION: 1. Probably benign 7 mm mass in the 4 o'clock position of the right
breast, most likely a cyst. Short-term follow-up recommended.

RECOMMENDATION:
Right breast ultrasound in 6 months to reassess the 4 o'clock
position 7 mm probably benign mass.

I have discussed the findings and recommendations with the patient.
If applicable, a reminder letter will be sent to the patient
regarding the next appointment.

BI-RADS CATEGORY  3: Probably benign.

## 2021-09-29 ENCOUNTER — Encounter (HOSPITAL_BASED_OUTPATIENT_CLINIC_OR_DEPARTMENT_OTHER): Payer: Self-pay | Admitting: *Deleted

## 2021-10-02 ENCOUNTER — Emergency Department (HOSPITAL_COMMUNITY): Payer: No Typology Code available for payment source

## 2021-10-02 ENCOUNTER — Inpatient Hospital Stay (HOSPITAL_COMMUNITY)
Admission: EM | Admit: 2021-10-02 | Discharge: 2021-10-04 | DRG: 419 | Disposition: A | Discharge status: Home / Self Care | Payer: Self-pay | Attending: Internal Medicine | Admitting: Internal Medicine

## 2021-10-02 ENCOUNTER — Encounter (HOSPITAL_COMMUNITY): Payer: Self-pay

## 2021-10-02 ENCOUNTER — Other Ambulatory Visit: Payer: Self-pay

## 2021-10-02 DIAGNOSIS — K810 Acute cholecystitis: Principal | ICD-10-CM

## 2021-10-02 DIAGNOSIS — Z79899 Other long term (current) drug therapy: Secondary | ICD-10-CM

## 2021-10-02 DIAGNOSIS — Z833 Family history of diabetes mellitus: Secondary | ICD-10-CM

## 2021-10-02 DIAGNOSIS — D259 Leiomyoma of uterus, unspecified: Secondary | ICD-10-CM

## 2021-10-02 DIAGNOSIS — J302 Other seasonal allergic rhinitis: Secondary | ICD-10-CM | POA: Diagnosis present

## 2021-10-02 DIAGNOSIS — E119 Type 2 diabetes mellitus without complications: Secondary | ICD-10-CM | POA: Diagnosis present

## 2021-10-02 DIAGNOSIS — I700 Atherosclerosis of aorta: Secondary | ICD-10-CM | POA: Diagnosis present

## 2021-10-02 DIAGNOSIS — K8062 Calculus of gallbladder and bile duct with acute cholecystitis without obstruction: Principal | ICD-10-CM | POA: Diagnosis present

## 2021-10-02 DIAGNOSIS — K589 Irritable bowel syndrome without diarrhea: Secondary | ICD-10-CM | POA: Diagnosis present

## 2021-10-02 DIAGNOSIS — K8050 Calculus of bile duct without cholangitis or cholecystitis without obstruction: Secondary | ICD-10-CM

## 2021-10-02 DIAGNOSIS — Z7984 Long term (current) use of oral hypoglycemic drugs: Secondary | ICD-10-CM

## 2021-10-02 DIAGNOSIS — K219 Gastro-esophageal reflux disease without esophagitis: Secondary | ICD-10-CM | POA: Diagnosis present

## 2021-10-02 DIAGNOSIS — Z8249 Family history of ischemic heart disease and other diseases of the circulatory system: Secondary | ICD-10-CM

## 2021-10-02 DIAGNOSIS — I10 Essential (primary) hypertension: Secondary | ICD-10-CM | POA: Diagnosis present

## 2021-10-02 DIAGNOSIS — K8042 Calculus of bile duct with acute cholecystitis without obstruction: Secondary | ICD-10-CM

## 2021-10-02 DIAGNOSIS — Z87891 Personal history of nicotine dependence: Secondary | ICD-10-CM

## 2021-10-02 LAB — CBC WITH DIFFERENTIAL/PLATELET
Abs Immature Granulocytes: 0.04 10*3/uL (ref 0.00–0.07)
Basophils Absolute: 0 10*3/uL (ref 0.0–0.1)
Basophils Relative: 0 %
Eosinophils Absolute: 0 10*3/uL (ref 0.0–0.5)
Eosinophils Relative: 0 %
HCT: 42.4 % (ref 36.0–46.0)
Hemoglobin: 13.9 g/dL (ref 12.0–15.0)
Immature Granulocytes: 0 %
Lymphocytes Relative: 11 %
Lymphs Abs: 1.6 10*3/uL (ref 0.7–4.0)
MCH: 30.4 pg (ref 26.0–34.0)
MCHC: 32.8 g/dL (ref 30.0–36.0)
MCV: 92.8 fL (ref 80.0–100.0)
Monocytes Absolute: 0.7 10*3/uL (ref 0.1–1.0)
Monocytes Relative: 5 %
Neutro Abs: 12.2 10*3/uL — ABNORMAL HIGH (ref 1.7–7.7)
Neutrophils Relative %: 84 %
Platelets: 382 10*3/uL (ref 150–400)
RBC: 4.57 MIL/uL (ref 3.87–5.11)
RDW: 13.4 % (ref 11.5–15.5)
WBC: 14.5 10*3/uL — ABNORMAL HIGH (ref 4.0–10.5)
nRBC: 0 % (ref 0.0–0.2)

## 2021-10-02 LAB — URINALYSIS, ROUTINE W REFLEX MICROSCOPIC
Bacteria, UA: NONE SEEN
Bilirubin Urine: NEGATIVE
Glucose, UA: 50 mg/dL — AB
Ketones, ur: 5 mg/dL — AB
Leukocytes,Ua: NEGATIVE
Nitrite: NEGATIVE
Protein, ur: NEGATIVE mg/dL
Specific Gravity, Urine: 1.016 (ref 1.005–1.030)
pH: 7 (ref 5.0–8.0)

## 2021-10-02 LAB — COMPREHENSIVE METABOLIC PANEL
ALT: 14 U/L (ref 0–44)
AST: 21 U/L (ref 15–41)
Albumin: 4.6 g/dL (ref 3.5–5.0)
Alkaline Phosphatase: 76 U/L (ref 38–126)
Anion gap: 11 (ref 5–15)
BUN: 8 mg/dL (ref 8–23)
CO2: 24 mmol/L (ref 22–32)
Calcium: 9.3 mg/dL (ref 8.9–10.3)
Chloride: 101 mmol/L (ref 98–111)
Creatinine, Ser: 0.68 mg/dL (ref 0.44–1.00)
GFR, Estimated: >60 mL/min (ref >60)
Glucose, Bld: 151 mg/dL — ABNORMAL HIGH (ref 70–99)
Potassium: 3.7 mmol/L (ref 3.5–5.1)
Sodium: 136 mmol/L (ref 135–145)
Total Bilirubin: 0.5 mg/dL (ref 0.3–1.2)
Total Protein: 8.2 g/dL — ABNORMAL HIGH (ref 6.5–8.1)

## 2021-10-02 LAB — GLUCOSE, CAPILLARY: Glucose-Capillary: 148 mg/dL — ABNORMAL HIGH (ref 70–99)

## 2021-10-02 LAB — LIPASE, BLOOD: Lipase: 30 U/L (ref 11–51)

## 2021-10-02 MED ORDER — METRONIDAZOLE 500 MG/100ML IV SOLN
500.0000 mg | Freq: Two times a day (BID) | INTRAVENOUS | Status: DC
  Administered 2021-10-03 (×2): 500 mg via INTRAVENOUS
  Filled 2021-10-02 (×2): qty 100

## 2021-10-02 MED ORDER — INSULIN ASPART 100 UNIT/ML IJ SOLN
0.0000 [IU] | INTRAMUSCULAR | Status: DC
  Administered 2021-10-02: 1 [IU] via SUBCUTANEOUS
  Administered 2021-10-03: 3 [IU] via SUBCUTANEOUS
  Administered 2021-10-03: 1 [IU] via SUBCUTANEOUS
  Administered 2021-10-03: 3 [IU] via SUBCUTANEOUS
  Administered 2021-10-03 – 2021-10-04 (×2): 1 [IU] via SUBCUTANEOUS
  Administered 2021-10-04: 2 [IU] via SUBCUTANEOUS

## 2021-10-02 MED ORDER — HYDRALAZINE HCL 20 MG/ML IJ SOLN: 5.0000 mg | Freq: Four times a day (QID) | INTRAMUSCULAR | Status: DC | PRN

## 2021-10-02 MED ORDER — ONDANSETRON 8 MG PO TBDP
8.0000 mg | ORAL_TABLET | Freq: Once | ORAL | Status: AC
  Administered 2021-10-02: 8 mg via ORAL
  Filled 2021-10-02: qty 1

## 2021-10-02 MED ORDER — SODIUM CHLORIDE 0.9 % IV SOLN
2.0000 g | INTRAVENOUS | Status: DC
  Administered 2021-10-03: 2 g via INTRAVENOUS
  Filled 2021-10-02: qty 20

## 2021-10-02 MED ORDER — IOHEXOL 300 MG/ML  SOLN
100.0000 mL | Freq: Once | INTRAMUSCULAR | Status: AC | PRN
Start: 2021-10-02 — End: 2021-10-02
  Administered 2021-10-02: 100 mL via INTRAVENOUS

## 2021-10-02 MED ORDER — DICYCLOMINE HCL 10 MG/ML IM SOLN
20.0000 mg | Freq: Once | INTRAMUSCULAR | Status: AC
  Administered 2021-10-02: 20 mg via INTRAMUSCULAR
  Filled 2021-10-02: qty 2

## 2021-10-02 MED ORDER — MORPHINE SULFATE (PF) 2 MG/ML IV SOLN
1.0000 mg | INTRAVENOUS | Status: DC | PRN
  Administered 2021-10-02 – 2021-10-03 (×3): 1 mg via INTRAVENOUS
  Filled 2021-10-02 (×3): qty 1

## 2021-10-02 MED ORDER — SODIUM CHLORIDE 0.9 % IV BOLUS
1000.0000 mL | Freq: Once | INTRAVENOUS | Status: AC
  Administered 2021-10-02: 1000 mL via INTRAVENOUS

## 2021-10-02 MED ORDER — METRONIDAZOLE 500 MG/100ML IV SOLN
500.0000 mg | Freq: Once | INTRAVENOUS | Status: AC
  Administered 2021-10-02: 500 mg via INTRAVENOUS
  Filled 2021-10-02: qty 100

## 2021-10-02 MED ORDER — ACETAMINOPHEN 325 MG PO TABS
650.0000 mg | ORAL_TABLET | Freq: Four times a day (QID) | ORAL | Status: DC | PRN
  Administered 2021-10-02: 650 mg via ORAL
  Filled 2021-10-02 (×2): qty 2

## 2021-10-02 MED ORDER — CEFTRIAXONE SODIUM 2 G IJ SOLR
2.0000 g | Freq: Once | INTRAMUSCULAR | Status: AC
Start: 2021-10-02 — End: 2021-10-02
  Administered 2021-10-02: 2 g via INTRAVENOUS
  Filled 2021-10-02: qty 20

## 2021-10-02 MED ORDER — MORPHINE SULFATE (PF) 4 MG/ML IV SOLN: 4.0000 mg | Freq: Once | INTRAVENOUS | Status: DC

## 2021-10-02 MED ORDER — ACETAMINOPHEN 650 MG RE SUPP: 650.0000 mg | Freq: Four times a day (QID) | RECTAL | Status: DC | PRN

## 2021-10-02 MED ORDER — INSULIN ASPART 100 UNIT/ML IJ SOLN: 0.0000 [IU] | Freq: Three times a day (TID) | INTRAMUSCULAR | Status: DC

## 2021-10-02 MED ORDER — ONDANSETRON HCL 4 MG/2ML IJ SOLN: 4.0000 mg | Freq: Four times a day (QID) | INTRAMUSCULAR | Status: DC | PRN

## 2021-10-02 MED ORDER — IOHEXOL 300 MG/ML  SOLN: 100.0000 mL | Freq: Once | INTRAMUSCULAR | Status: DC | PRN

## 2021-10-02 MED ORDER — SODIUM CHLORIDE 0.9 % IV SOLN: INTRAVENOUS | Status: AC

## 2021-10-02 MED ORDER — HYDROCODONE-ACETAMINOPHEN 5-325 MG PO TABS
1.0000 | ORAL_TABLET | Freq: Once | ORAL | Status: AC
  Administered 2021-10-02: 1 via ORAL
  Filled 2021-10-02: qty 1

## 2021-10-02 NOTE — ED Triage Notes (Signed)
Pt c/o waking up with epigastric pain and nausea around 0300 this morning. States the pain is similar to her IBS, however, this pain has lasted longer than usual.

## 2021-10-02 NOTE — H&P (Signed)
History and Physical    Raven Weaver HCW:[id number] DOB: Jul 16, 1960 DOA: 10/02/2021  PCP: Billie Ruddy, MD  Patient coming from: Home  Chief Complaint: Abdominal pain  HPI: Raven Weaver is a 61 y.o. female with medical history significant of hypertension, type 2 diabetes, GERD, nephrolithiasis, SVT, PVCs presenting to the ED with complaints of epigastric abdominal pain, nausea, and vomiting.  Hypertensive but remainder of vital signs stable.  Labs significant for WBC 14.5, normal lipase and LFTs, UA without signs of infection.  CT abdomen pelvis with contrast showing cholelithiasis with possible cholecystitis.  Showing multiple hyperenhancing foci within the liver, nonspecific.  Nonemergent outpatient pre and post contrast abdominal MRI recommended for further evaluation.  Right upper quadrant ultrasound showing findings consistent with acute cholecystitis and dilated CBD measuring up to 10 mm concerning for choledocholithiasis. Patient was given Bentyl, Norco, Zofran, ceftriaxone, Flagyl, and 1 L normal saline bolus.  General surgery consulted (Dr. Marcello Moores).  Patient states today at 3 AM she started experiencing acute onset severe, "15 out of 10 intensity," constant, aching epigastric abdominal pain associated with nausea and vomiting.  She denies history of gallstones.  Denies fevers, chills, cough, shortness of breath, or chest pain.  No other complaints.  Review of Systems:  Review of Systems  All other systems reviewed and are negative.   Past Medical History:  Diagnosis Date   Abnormal Pap smear of cervix    remote, > 25 years ago, s/p cone bxs, all normal since per her report   Diabetes mellitus without complication ([place])    Essential hypertension 04/08/2015   GERD (gastroesophageal reflux disease)    History of kidney stones    Hyperglycemia    gestational diabetes   Kidney stones    remote, > 30 years ago   PVC (premature ventricular contraction) 12/26/2014    Seasonal allergies 08/07/2014   SVT (supraventricular tachycardia) ([place]) 12/26/2014   Tobacco use     Past Surgical History:  Procedure Laterality Date   ABLATION     uterine   CESAREAN SECTION     x2     reports that she quit smoking about 7 years ago. Her smoking use included cigarettes. She smoked an average of .25 packs per day. She has never used smokeless tobacco. She reports current alcohol use. She reports that she does not use drugs.  No Known Allergies  Family History  Problem Relation Age of Onset   Hypertension Mother    Diabetes Mother    Heart disease Mother 39       MI   CAD Mother 33   Multiple myeloma Mother    Uterine cancer Maternal Aunt    Diabetes Maternal Grandfather    Hypertension Maternal Grandfather    Throat cancer Paternal Grandmother    Hypertension Maternal Grandmother    Colon cancer Neg Hx     Prior to Admission medications   Medication Sig Start Date End Date Taking? Authorizing Provider  lisinopril (ZESTRIL) 10 MG tablet Take 1 tablet (10 mg total) by mouth daily. 05/09/21  Yes Billie Ruddy, MD  metFORMIN (GLUCOPHAGE) 1000 MG tablet Take 1 tablet (1,000 mg total) by mouth 2 (two) times daily with a meal. 05/09/21  Yes Billie Ruddy, MD  metoprolol tartrate (LOPRESSOR) 50 MG tablet Take 1 tablet (50 mg total) by mouth 2 (two) times daily. 05/09/21  Yes Billie Ruddy, MD  nitroGLYCERIN (NITROSTAT) 0.4 MG SL tablet DISSOLVE ONE TABLET UNDER THE TONGUE EVERY  5 MINUTES AS NEEDED FOR CHEST PAIN.  DO NOT EXCEED A TOTAL OF 3 DOSES IN 15 MINUTES Patient taking differently: Place 0.4 mg under the tongue every 5 (five) minutes as needed for chest pain. 09/12/21  Yes Skeet Latch, MD    Physical Exam: Vitals:   10/02/21 2030 10/02/21 2130 10/02/21 2220 10/02/21 2223  BP: (!) 142/76 (!) 160/56 (!) 187/53   Pulse: (!) 47 78 (!) 53   Resp: (!) 22 (!) 22 20   Temp:   99.4 F (37.4 C)   TempSrc:   Oral   SpO2: 92% 97% 100%   Weight:     67.7 kg  Height:    '5\' 4"'  (1.626 m)    Physical Exam Vitals reviewed.  Constitutional:      General: She is not in acute distress. HENT:     Head: Normocephalic and atraumatic.  Eyes:     Extraocular Movements: Extraocular movements intact.  Cardiovascular:     Rate and Rhythm: Normal rate and regular rhythm.     Pulses: Normal pulses.  Pulmonary:     Effort: Pulmonary effort is normal. No respiratory distress.     Breath sounds: Normal breath sounds.  Abdominal:     General: Bowel sounds are normal. There is no distension.     Palpations: Abdomen is soft.     Tenderness: There is abdominal tenderness. There is guarding.     Comments: Right upper quadrant tender to palpation  Musculoskeletal:        General: No swelling or tenderness.     Cervical back: Normal range of motion.  Skin:    General: Skin is warm and dry.  Neurological:     General: No focal deficit present.     Mental Status: She is alert and oriented to person, place, and time.      Labs on Admission: I have personally reviewed following labs and imaging studies  CBC: Recent Labs  Lab 10/02/21 1425  WBC 14.5*  NEUTROABS 12.2*  HGB 13.9  HCT 42.4  MCV 92.8  PLT 850   Basic Metabolic Panel: Recent Labs  Lab 10/02/21 1425  NA 136  K 3.7  CL 101  CO2 24  GLUCOSE 151*  BUN 8  CREATININE 0.68  CALCIUM 9.3   GFR: Estimated Creatinine Clearance: 68.9 mL/min (by C-G formula based on SCr of 0.68 mg/dL). Liver Function Tests: Recent Labs  Lab 10/02/21 1425  AST 21  ALT 14  ALKPHOS 76  BILITOT 0.5  PROT 8.2*  ALBUMIN 4.6   Recent Labs  Lab 10/02/21 1425  LIPASE 30   No results for input(s): "AMMONIA" in the last 168 hours. Coagulation Profile: No results for input(s): "INR", "PROTIME" in the last 168 hours. Cardiac Enzymes: No results for input(s): "CKTOTAL", "CKMB", "CKMBINDEX", "TROPONINI" in the last 168 hours. BNP (last 3 results) No results for input(s): "PROBNP" in the last  8760 hours. HbA1C: No results for input(s): "HGBA1C" in the last 72 hours. CBG: No results for input(s): "GLUCAP" in the last 168 hours. Lipid Profile: No results for input(s): "CHOL", "HDL", "LDLCALC", "TRIG", "CHOLHDL", "LDLDIRECT" in the last 72 hours. Thyroid Function Tests: No results for input(s): "TSH", "T4TOTAL", "FREET4", "T3FREE", "THYROIDAB" in the last 72 hours. Anemia Panel: No results for input(s): "VITAMINB12", "FOLATE", "FERRITIN", "TIBC", "IRON", "RETICCTPCT" in the last 72 hours. Urine analysis:    Component Value Date/Time   COLORURINE YELLOW 10/02/2021 1350   APPEARANCEUR CLEAR 10/02/2021 1350   LABSPEC 1.016  10/02/2021 1350   PHURINE 7.0 10/02/2021 1350   GLUCOSEU 50 (A) 10/02/2021 1350   HGBUR MODERATE (A) 10/02/2021 1350   BILIRUBINUR NEGATIVE 10/02/2021 1350   KETONESUR 5 (A) 10/02/2021 1350   PROTEINUR NEGATIVE 10/02/2021 1350   NITRITE NEGATIVE 10/02/2021 1350   LEUKOCYTESUR NEGATIVE 10/02/2021 1350    Radiological Exams on Admission: I have personally reviewed images US Abdomen Limited RUQ (LIVER/GB)  Result Date: 10/02/2021 CLINICAL DATA:  Pain EXAM: ULTRASOUND ABDOMEN LIMITED RIGHT UPPER QUADRANT COMPARISON:  None Available. FINDINGS: Gallbladder: Edematous and thickened gallbladder wall. Multiple gallstones, largest measures 1.1 cm. Positive sonographic Murphy sign noted by sonographer. Common bile duct: Diameter: 10 mm No intrahepatic biliary ductal dilation. Liver: No focal lesion identified. Within normal limits in parenchymal echogenicity. Portal vein is patent on color Doppler imaging with normal direction of blood flow towards the liver. Other: None. IMPRESSION: 1. Gallbladder wall thickening, gallstones and positive sonographic Murphy sign, findings are consistent with acute cholecystitis. 2. Dilated common bile duct, measuring up to 10 mm, concerning for choledocholithiasis. MRCP could be performed for further evaluation. Electronically Signed   By:  Yetta Glassman M.D.   On: 10/02/2021 20:14   CT Abdomen Pelvis W Contrast  Result Date: 10/02/2021 CLINICAL DATA:  "Acute severe pancreatitis".  History of diabetes. EXAM: CT ABDOMEN AND PELVIS WITH CONTRAST TECHNIQUE: Multidetector CT imaging of the abdomen and pelvis was performed using the standard protocol following bolus administration of intravenous contrast. RADIATION DOSE REDUCTION: This exam was performed according to the departmental dose-optimization program which includes automated exposure control, adjustment of the mA and/or kV according to patient size and/or use of iterative reconstruction technique. CONTRAST:  136m OMNIPAQUE IOHEXOL 300 MG/ML  SOLN COMPARISON:  None Available. FINDINGS: Lower chest: Clear lung bases. Normal heart size without pericardial or pleural effusion. Hepatobiliary: Subtle areas of hepatic hyperenhancement including within the high right hepatic lobe at 11 mm on 20/2 and within segment 2 at 9 mm on 24/2. Scattered tiny hepatic cysts. Multiple gallstones with possible gallbladder wall thickening including at 1.0 cm on 39/2. No biliary duct dilatation. Pancreas: The pancreas enhances homogeneously. No convincing evidence of peripancreatic edema. No duct dilatation. Spleen: Normal in size, without focal abnormality. Adrenals/Urinary Tract: Normal adrenal glands. Too small to characterize interpolar left renal lesion . In the absence of clinically indicated signs/symptoms require(s) no independent follow-up. Normal right kidney. No hydronephrosis. Normal urinary bladder. Stomach/Bowel: Normal stomach, without wall thickening. Extensive colonic diverticulosis. Normal terminal ileum and appendix. Normal small bowel. Vascular/Lymphatic: Aortic atherosclerosis. No abdominopelvic adenopathy. Reproductive: Uterine fibroids, including off the fundus with calcification at 2.4 cm. No adnexal mass. Other: No significant free fluid.  No free intraperitoneal air. Musculoskeletal:  Degenerative changes of both hips. IMPRESSION: 1. No evidence of pancreatitis or its complications. 2. Cholelithiasis with possible cholecystitis. Correlate with right upper quadrant symptoms and consider ultrasound 3. Multiple hyperenhancing foci within the liver, nonspecific. Correlate with liver disease and primary malignancy history. Consider nonemergent, outpatient pre and post contrast abdominal MRI. 4. Fibroid uterus 5.  Aortic Atherosclerosis (ICD10-I70.0). Electronically Signed   By: KAbigail MiyamotoM.D.   On: 10/02/2021 18:02    EKG: Independently reviewed.  Sinus rhythm, frequent PVCs.  Assessment and Plan  Acute cholecystitis with choledocholithiasis Patient presenting with acute onset epigastric/right upper quadrant abdominal pain, nausea, and vomiting.  Right upper quadrant ultrasound showing findings consistent with acute cholecystitis and dilated CBD measuring up to 10 mm concerning for choledocholithiasis.  LFTs are normal.  WBC  14.5 but no fever to suggest acute cholangitis.  No signs of sepsis at this time. -General surgery consulted -May need MRCP for further evaluation but will wait for general surgery input. -Keep n.p.o. -IV fluid hydration -Pain management -Antiemetic as needed -Continue ceftriaxone and Flagyl -Repeat CBC and CMP in a.m.  Abnormal imaging findings CT showing multiple hyperenhancing foci within the liver, nonspecific.   -Radiologist recommending nonemergent outpatient pre and post contrast abdominal MRI for further evaluation.  Hypertension -IV hydralazine prn -Resume home p.o. meds when no longer NPO.  Type 2 diabetes Well-controlled - A1c 5.9 on 05/09/2021. -Sensitive sliding scale insulin every 4 hours  DVT prophylaxis: SCDs Code Status: Full Code (discussed with the patient) Family Communication: No family available at this time. Consults called: General surgery Level of care: Telemetry bed Admission status: It is my clinical opinion that  admission to INPATIENT is reasonable and necessary because of the expectation that this patient will require hospital care that crosses at least 2 midnights to treat this condition based on the medical complexity of the problems presented.  Given the aforementioned information, the predictability of an adverse outcome is felt to be significant.   Shela Leff MD [facility]  If 7PM-7AM, please contact night-coverage [email or web address]  10/02/2021, 10:41 PM

## 2021-10-02 NOTE — Consult Note (Signed)
CC: abd pain  Requesting provider: Dr Ernesto Rutherford  HPI: Raven Weaver is an 61 y.o. female who is here for acute onset of midepigastric pain that started early Thur AM.    Associated with nausea.  Symptoms did not go away so she presented to ED for evaluation.  Past Medical History:  Diagnosis Date   Abnormal Pap smear of cervix    remote, > 25 years ago, s/p cone bxs, all normal since per her report   Diabetes mellitus without complication ([place])    Essential hypertension 04/08/2015   GERD (gastroesophageal reflux disease)    History of kidney stones    Hyperglycemia    gestational diabetes   Kidney stones    remote, > 30 years ago   PVC (premature ventricular contraction) 12/26/2014   Seasonal allergies 08/07/2014   SVT (supraventricular tachycardia) ([place]) 12/26/2014   Tobacco use     Past Surgical History:  Procedure Laterality Date   ABLATION     uterine   CESAREAN SECTION     x2    Family History  Problem Relation Age of Onset   Hypertension Mother    Diabetes Mother    Heart disease Mother 46       MI   CAD Mother 43   Multiple myeloma Mother    Uterine cancer Maternal Aunt    Diabetes Maternal Grandfather    Hypertension Maternal Grandfather    Throat cancer Paternal Grandmother    Hypertension Maternal Grandmother    Colon cancer Neg Hx     Social:  reports that she quit smoking about 7 years ago. Her smoking use included cigarettes. She smoked an average of .25 packs per day. She has never used smokeless tobacco. She reports current alcohol use. She reports that she does not use drugs.  Allergies: No Known Allergies  Medications: I have reviewed the patient's current medications.  Results for orders placed or performed during the hospital encounter of 10/02/21 (from the past 48 hour(s))  Urinalysis, Routine w reflex microscopic Urine, Clean Catch     Status: Abnormal   Collection Time: 10/02/21  1:50 PM  Result Value Ref Range   Color, Urine  YELLOW YELLOW   APPearance CLEAR CLEAR   Specific Gravity, Urine 1.016 1.005 - 1.030   pH 7.0 5.0 - 8.0   Glucose, UA 50 (A) NEGATIVE mg/dL   Hgb urine dipstick MODERATE (A) NEGATIVE   Bilirubin Urine NEGATIVE NEGATIVE   Ketones, ur 5 (A) NEGATIVE mg/dL   Protein, ur NEGATIVE NEGATIVE mg/dL   Nitrite NEGATIVE NEGATIVE   Leukocytes,Ua NEGATIVE NEGATIVE   RBC / HPF 6-10 0 - 5 RBC/hpf   WBC, UA 0-5 0 - 5 WBC/hpf   Bacteria, UA NONE SEEN NONE SEEN    Comment: Performed at [facility], [place] [address], [place], [place] [address]  CBC with Differential     Status: Abnormal   Collection Time: 10/02/21  2:25 PM  Result Value Ref Range   WBC 14.5 (H) 4.0 - 10.5 K/uL   RBC 4.57 3.87 - 5.11 MIL/uL   Hemoglobin 13.9 12.0 - 15.0 g/dL   HCT 42.4 36.0 - 46.0 %   MCV 92.8 80.0 - 100.0 fL   MCH 30.4 26.0 - 34.0 pg   MCHC 32.8 30.0 - 36.0 g/dL   RDW 13.4 11.5 - 15.5 %   Platelets 382 150 - 400 K/uL   nRBC 0.0 0.0 - 0.2 %   Neutrophils Relative %  84 %   Neutro Abs 12.2 (H) 1.7 - 7.7 K/uL   Lymphocytes Relative 11 %   Lymphs Abs 1.6 0.7 - 4.0 K/uL   Monocytes Relative 5 %   Monocytes Absolute 0.7 0.1 - 1.0 K/uL   Eosinophils Relative 0 %   Eosinophils Absolute 0.0 0.0 - 0.5 K/uL   Basophils Relative 0 %   Basophils Absolute 0.0 0.0 - 0.1 K/uL   Immature Granulocytes 0 %   Abs Immature Granulocytes 0.04 0.00 - 0.07 K/uL    Comment: Performed at [facility], [place] [address], [place], [place] [address]  Comprehensive metabolic panel     Status: Abnormal   Collection Time: 10/02/21  2:25 PM  Result Value Ref Range   Sodium 136 135 - 145 mmol/L   Potassium 3.7 3.5 - 5.1 mmol/L   Chloride 101 98 - 111 mmol/L   CO2 24 22 - 32 mmol/L   Glucose, Bld 151 (H) 70 - 99 mg/dL    Comment: Glucose reference range applies only to samples taken after fasting for at least 8 hours.   BUN 8 8 - 23 mg/dL   Creatinine, Ser 0.68 0.44 - 1.00 mg/dL   Calcium 9.3 8.9 - 10.3  mg/dL   Total Protein 8.2 (H) 6.5 - 8.1 g/dL   Albumin 4.6 3.5 - 5.0 g/dL   AST 21 15 - 41 U/L   ALT 14 0 - 44 U/L   Alkaline Phosphatase 76 38 - 126 U/L   Total Bilirubin 0.5 0.3 - 1.2 mg/dL   GFR, Estimated >60 >60 mL/min    Comment: (NOTE) Calculated using the CKD-EPI Creatinine Equation (2021)    Anion gap 11 5 - 15    Comment: Performed at [facility], [place] [address], [place], [place] [address]  Lipase, blood     Status: None   Collection Time: 10/02/21  2:25 PM  Result Value Ref Range   Lipase 30 11 - 51 U/L    Comment: Performed at [facility], [place] [address] [place], [place] [address]    US Abdomen Limited RUQ (LIVER/GB)  Result Date: 10/02/2021 CLINICAL DATA:  Pain EXAM: ULTRASOUND ABDOMEN LIMITED RIGHT UPPER QUADRANT COMPARISON:  None Available. FINDINGS: Gallbladder: Edematous and thickened gallbladder wall. Multiple gallstones, largest measures 1.1 cm. Positive sonographic Murphy sign noted by sonographer. Common bile duct: Diameter: 10 mm No intrahepatic biliary ductal dilation. Liver: No focal lesion identified. Within normal limits in parenchymal echogenicity. Portal vein is patent on color Doppler imaging with normal direction of blood flow towards the liver. Other: None. IMPRESSION: 1. Gallbladder wall thickening, gallstones and positive sonographic Murphy sign, findings are consistent with acute cholecystitis. 2. Dilated common bile duct, measuring up to 10 mm, concerning for choledocholithiasis. MRCP could be performed for further evaluation. Electronically Signed   By: Yetta Glassman M.D.   On: 10/02/2021 20:14   CT Abdomen Pelvis W Contrast  Result Date: 10/02/2021 CLINICAL DATA:  "Acute severe pancreatitis".  History of diabetes. EXAM: CT ABDOMEN AND PELVIS WITH CONTRAST TECHNIQUE: Multidetector CT imaging of the abdomen and pelvis was performed using the standard protocol following bolus administration of intravenous  contrast. RADIATION DOSE REDUCTION: This exam was performed according to the departmental dose-optimization program which includes automated exposure control, adjustment of the mA and/or kV according to patient size and/or use of iterative reconstruction technique. CONTRAST:  183m OMNIPAQUE IOHEXOL 300 MG/ML  SOLN COMPARISON:  None Available. FINDINGS: Lower chest: Clear lung  bases. Normal heart size without pericardial or pleural effusion. Hepatobiliary: Subtle areas of hepatic hyperenhancement including within the high right hepatic lobe at 11 mm on 20/2 and within segment 2 at 9 mm on 24/2. Scattered tiny hepatic cysts. Multiple gallstones with possible gallbladder wall thickening including at 1.0 cm on 39/2. No biliary duct dilatation. Pancreas: The pancreas enhances homogeneously. No convincing evidence of peripancreatic edema. No duct dilatation. Spleen: Normal in size, without focal abnormality. Adrenals/Urinary Tract: Normal adrenal glands. Too small to characterize interpolar left renal lesion . In the absence of clinically indicated signs/symptoms require(s) no independent follow-up. Normal right kidney. No hydronephrosis. Normal urinary bladder. Stomach/Bowel: Normal stomach, without wall thickening. Extensive colonic diverticulosis. Normal terminal ileum and appendix. Normal small bowel. Vascular/Lymphatic: Aortic atherosclerosis. No abdominopelvic adenopathy. Reproductive: Uterine fibroids, including off the fundus with calcification at 2.4 cm. No adnexal mass. Other: No significant free fluid.  No free intraperitoneal air. Musculoskeletal: Degenerative changes of both hips. IMPRESSION: 1. No evidence of pancreatitis or its complications. 2. Cholelithiasis with possible cholecystitis. Correlate with right upper quadrant symptoms and consider ultrasound 3. Multiple hyperenhancing foci within the liver, nonspecific. Correlate with liver disease and primary malignancy history. Consider nonemergent,  outpatient pre and post contrast abdominal MRI. 4. Fibroid uterus 5.  Aortic Atherosclerosis (ICD10-I70.0). Electronically Signed   By: Abigail Miyamoto M.D.   On: 10/02/2021 18:02    ROS - all of the below systems have been reviewed with the patient and positives are indicated with bold text General: chills, fever or night sweats Eyes: blurry vision or double vision ENT: epistaxis or sore throat Hematologic/Lymphatic: bleeding problems, blood clots or swollen lymph nodes Endocrine: temperature intolerance or unexpected weight changes Breast: new or changing breast lumps or nipple discharge Resp: cough, shortness of breath, or wheezing CV: chest pain or dyspnea on exertion GI: as per HPI GU: dysuria, trouble voiding, or hematuria Neuro: TIA or stroke symptoms    PE Blood pressure (!) 173/71, pulse 88, temperature 98.5 F (36.9 C), resp. rate 19, height '5\' 4"'  (1.626 m), weight 66.7 kg, last menstrual period 02/16/2006, SpO2 100 %. Constitutional: NAD; conversant; no deformities Eyes: Moist conjunctiva; no lid lag; anicteric; PERRL Neck: Trachea midline; no thyromegaly Lungs: Normal respiratory effort CV: RRR GI: Abd soft, TTP RUQ MSK: Normal range of motion of extremities; no clubbing/cyanosis Psychiatric: Appropriate affect; alert and oriented x3  Results for orders placed or performed during the hospital encounter of 10/02/21 (from the past 48 hour(s))  Urinalysis, Routine w reflex microscopic Urine, Clean Catch     Status: Abnormal   Collection Time: 10/02/21  1:50 PM  Result Value Ref Range   Color, Urine YELLOW YELLOW   APPearance CLEAR CLEAR   Specific Gravity, Urine 1.016 1.005 - 1.030   pH 7.0 5.0 - 8.0   Glucose, UA 50 (A) NEGATIVE mg/dL   Hgb urine dipstick MODERATE (A) NEGATIVE   Bilirubin Urine NEGATIVE NEGATIVE   Ketones, ur 5 (A) NEGATIVE mg/dL   Protein, ur NEGATIVE NEGATIVE mg/dL   Nitrite NEGATIVE NEGATIVE   Leukocytes,Ua NEGATIVE NEGATIVE   RBC / HPF 6-10 0 -  5 RBC/hpf   WBC, UA 0-5 0 - 5 WBC/hpf   Bacteria, UA NONE SEEN NONE SEEN    Comment: Performed at [facility], [place] [address], [place], [place] [address]  CBC with Differential     Status: Abnormal   Collection Time: 10/02/21  2:25 PM  Result Value Ref Range   WBC 14.5 (  H) 4.0 - 10.5 K/uL   RBC 4.57 3.87 - 5.11 MIL/uL   Hemoglobin 13.9 12.0 - 15.0 g/dL   HCT 42.4 36.0 - 46.0 %   MCV 92.8 80.0 - 100.0 fL   MCH 30.4 26.0 - 34.0 pg   MCHC 32.8 30.0 - 36.0 g/dL   RDW 13.4 11.5 - 15.5 %   Platelets 382 150 - 400 K/uL   nRBC 0.0 0.0 - 0.2 %   Neutrophils Relative % 84 %   Neutro Abs 12.2 (H) 1.7 - 7.7 K/uL   Lymphocytes Relative 11 %   Lymphs Abs 1.6 0.7 - 4.0 K/uL   Monocytes Relative 5 %   Monocytes Absolute 0.7 0.1 - 1.0 K/uL   Eosinophils Relative 0 %   Eosinophils Absolute 0.0 0.0 - 0.5 K/uL   Basophils Relative 0 %   Basophils Absolute 0.0 0.0 - 0.1 K/uL   Immature Granulocytes 0 %   Abs Immature Granulocytes 0.04 0.00 - 0.07 K/uL    Comment: Performed at [facility], [place] [address] [place], [place] [address]  Comprehensive metabolic panel     Status: Abnormal   Collection Time: 10/02/21  2:25 PM  Result Value Ref Range   Sodium 136 135 - 145 mmol/L   Potassium 3.7 3.5 - 5.1 mmol/L   Chloride 101 98 - 111 mmol/L   CO2 24 22 - 32 mmol/L   Glucose, Bld 151 (H) 70 - 99 mg/dL    Comment: Glucose reference range applies only to samples taken after fasting for at least 8 hours.   BUN 8 8 - 23 mg/dL   Creatinine, Ser 0.68 0.44 - 1.00 mg/dL   Calcium 9.3 8.9 - 10.3 mg/dL   Total Protein 8.2 (H) 6.5 - 8.1 g/dL   Albumin 4.6 3.5 - 5.0 g/dL   AST 21 15 - 41 U/L   ALT 14 0 - 44 U/L   Alkaline Phosphatase 76 38 - 126 U/L   Total Bilirubin 0.5 0.3 - 1.2 mg/dL   GFR, Estimated >60 >60 mL/min    Comment: (NOTE) Calculated using the CKD-EPI Creatinine Equation (2021)    Anion gap 11 5 - 15    Comment: Performed at [facility], [place] [address], [place], [place] [address]  Lipase, blood     Status: None   Collection Time: 10/02/21  2:25 PM  Result Value Ref Range   Lipase 30 11 - 51 U/L    Comment: Performed at [facility], [place] [address], [place], [place] [address]    US Abdomen Limited RUQ (LIVER/GB)  Result Date: 10/02/2021 CLINICAL DATA:  Pain EXAM: ULTRASOUND ABDOMEN LIMITED RIGHT UPPER QUADRANT COMPARISON:  None Available. FINDINGS: Gallbladder: Edematous and thickened gallbladder wall. Multiple gallstones, largest measures 1.1 cm. Positive sonographic Murphy sign noted by sonographer. Common bile duct: Diameter: 10 mm No intrahepatic biliary ductal dilation. Liver: No focal lesion identified. Within normal limits in parenchymal echogenicity. Portal vein is patent on color Doppler imaging with normal direction of blood flow towards the liver. Other: None. IMPRESSION: 1. Gallbladder wall thickening, gallstones and positive sonographic Murphy sign, findings are consistent with acute cholecystitis. 2. Dilated common bile duct, measuring up to 10 mm, concerning for choledocholithiasis. MRCP could be performed for further evaluation. Electronically Signed   By: Yetta Glassman M.D.   On: 10/02/2021 20:14   CT Abdomen Pelvis W Contrast  Result Date: 10/02/2021 CLINICAL DATA:  "Acute severe pancreatitis".  History of  diabetes. EXAM: CT ABDOMEN AND PELVIS WITH CONTRAST TECHNIQUE: Multidetector CT imaging of the abdomen and pelvis was performed using the standard protocol following bolus administration of intravenous contrast. RADIATION DOSE REDUCTION: This exam was performed according to the departmental dose-optimization program which includes automated exposure control, adjustment of the mA and/or kV according to patient size and/or use of iterative reconstruction technique. CONTRAST:  151m OMNIPAQUE IOHEXOL 300 MG/ML  SOLN COMPARISON:  None Available. FINDINGS: Lower chest: Clear lung bases.  Normal heart size without pericardial or pleural effusion. Hepatobiliary: Subtle areas of hepatic hyperenhancement including within the high right hepatic lobe at 11 mm on 20/2 and within segment 2 at 9 mm on 24/2. Scattered tiny hepatic cysts. Multiple gallstones with possible gallbladder wall thickening including at 1.0 cm on 39/2. No biliary duct dilatation. Pancreas: The pancreas enhances homogeneously. No convincing evidence of peripancreatic edema. No duct dilatation. Spleen: Normal in size, without focal abnormality. Adrenals/Urinary Tract: Normal adrenal glands. Too small to characterize interpolar left renal lesion . In the absence of clinically indicated signs/symptoms require(s) no independent follow-up. Normal right kidney. No hydronephrosis. Normal urinary bladder. Stomach/Bowel: Normal stomach, without wall thickening. Extensive colonic diverticulosis. Normal terminal ileum and appendix. Normal small bowel. Vascular/Lymphatic: Aortic atherosclerosis. No abdominopelvic adenopathy. Reproductive: Uterine fibroids, including off the fundus with calcification at 2.4 cm. No adnexal mass. Other: No significant free fluid.  No free intraperitoneal air. Musculoskeletal: Degenerative changes of both hips. IMPRESSION: 1. No evidence of pancreatitis or its complications. 2. Cholelithiasis with possible cholecystitis. Correlate with right upper quadrant symptoms and consider ultrasound 3. Multiple hyperenhancing foci within the liver, nonspecific. Correlate with liver disease and primary malignancy history. Consider nonemergent, outpatient pre and post contrast abdominal MRI. 4. Fibroid uterus 5.  Aortic Atherosclerosis (ICD10-I70.0). Electronically Signed   By: KAbigail MiyamotoM.D.   On: 10/02/2021 18:02     A/P: CIYAUNA SINGis an 61y.o. female with acute cholecystitis and possible choledocholithiasis, although T bili is not signifcantly elevated.  Agree with admission, IV pain and nausea control, IV  abx, NPO.  I think it would be reasonable to do lap chole today with IOC.   ARosario Adie MD  Colorectal and General Surgery [facility]  Total time of evaluation, examination, counseling and implementing medical decisions was 40 mins.  Medical decision making was straightforward decision making.

## 2021-10-02 NOTE — ED Provider Triage Note (Signed)
Emergency Medicine Provider Triage Evaluation Note  Raven Weaver , a 61 y.o. female  was evaluated in triage.  Pt complains of abdominal pain.  Patient states that symptoms been constant since approximately 3 AM this morning.  She has history of IBS and states pain feels similar but episodes usually last 15 to 20 minutes.  Pain is located in the epigastric region and described as dull/achy.  Denies history of alcohol use prior to symptom onset.  Denies fever.  States that she has had several episodes of nausea and vomiting that have been nonbloody in appearance today.  This is uncommon for her episodes of IBS in the past.  She has not had anything to eat.  Last bowel movement yesterday.  Denies urinary/vaginal symptoms, change in bowel habits.  Prior abdominal surgeries include 2 cesarean sections.  Review of Systems  Positive: See above Negative:   Physical Exam  BP (!) 156/86 (BP Location: Right Arm)   Pulse 80   Temp 98.5 F (36.9 C) (Oral)   Resp 18   LMP 02/16/2006   SpO2 98%  Gen:   Awake, no distress   Resp:  Normal effort  MSK:   Moves extremities without difficulty  Other:  Epigastric tenderness to palpation.  No CVA tenderness bilaterally.  Medical Decision Making  Medically screening exam initiated at 1:49 PM.  Appropriate orders placed.  MCKINSLEY KOELZER was informed that the remainder of the evaluation will be completed by another provider, this initial triage assessment does not replace that evaluation, and the importance of remaining in the ED until their evaluation is complete.     Wilnette Kales, [place] 10/02/21 1350

## 2021-10-02 NOTE — ED Provider Notes (Signed)
[facility] DEPT Provider Note   CSN: [id number] Arrival date & time: 10/02/21  1326     History  Chief Complaint  Patient presents with  . Abdominal Pain    Raven Weaver is a 61 y.o. female.   Abdominal Pain   61 year old female presents to the ED with complaints of abdominal pain.  Patient states that symptoms been constant since approximately 3 AM this morning.  She has history of IBS and states pain feels similar but episodes usually last 15 to 20 minutes.  Pain is located in the epigastric region and described as dull/achy.  Denies history of alcohol use prior to symptom onset.  Denies fever.  States that she has had several episodes of nausea and vomiting that have been nonbloody in appearance today.  This is uncommon for her episodes of IBS in the past.  She has not had anything to eat.  Last bowel movement yesterday.  Denies urinary/vaginal symptoms, change in bowel habits, chest pain, shortness of breath.  Prior abdominal surgeries include 2 cesarean sections.  Past medical history significant for hypertension, SVT, diabetes mellitus, GERD, hyperglycemia  Home Medications Prior to Admission medications   Medication Sig Start Date End Date Taking? Authorizing Provider  lisinopril (ZESTRIL) 10 MG tablet Take 1 tablet (10 mg total) by mouth daily. 05/09/21  Yes Billie Ruddy, MD  metFORMIN (GLUCOPHAGE) 1000 MG tablet Take 1 tablet (1,000 mg total) by mouth 2 (two) times daily with a meal. 05/09/21  Yes Billie Ruddy, MD  metoprolol tartrate (LOPRESSOR) 50 MG tablet Take 1 tablet (50 mg total) by mouth 2 (two) times daily. 05/09/21  Yes Billie Ruddy, MD  nitroGLYCERIN (NITROSTAT) 0.4 MG SL tablet DISSOLVE ONE TABLET UNDER THE TONGUE EVERY 5 MINUTES AS NEEDED FOR CHEST PAIN.  DO NOT EXCEED A TOTAL OF 3 DOSES IN 15 MINUTES Patient taking differently: Place 0.4 mg under the tongue every 5 (five) minutes as needed for chest pain. 09/12/21   Yes Skeet Latch, MD      Allergies    Patient has no known allergies.    Review of Systems   Review of Systems  Gastrointestinal:  Positive for abdominal pain.    Physical Exam Updated Vital Signs BP (!) 142/76   Pulse (!) 47   Temp 98.5 F (36.9 C)   Resp (!) 22   Ht '5\' 4"'$  (1.626 m)   Wt 66.7 kg   LMP 02/16/2006   SpO2 92%   BMI 25.23 kg/m  Physical Exam Vitals and nursing note reviewed.  Constitutional:      General: She is not in acute distress.    Appearance: She is well-developed.  HENT:     Head: Normocephalic and atraumatic.  Eyes:     Conjunctiva/sclera: Conjunctivae normal.  Cardiovascular:     Rate and Rhythm: Normal rate and regular rhythm.     Heart sounds: No murmur heard. Pulmonary:     Effort: Pulmonary effort is normal. No respiratory distress.     Breath sounds: Normal breath sounds.  Abdominal:     Palpations: Abdomen is soft.     Tenderness: There is abdominal tenderness in the epigastric area. There is no right CVA tenderness or left CVA tenderness. Negative signs include Murphy's sign and McBurney's sign.  Musculoskeletal:        General: No swelling.     Cervical back: Neck supple.  Skin:    General: Skin is warm and dry.  Capillary Refill: Capillary refill takes less than 2 seconds.  Neurological:     Mental Status: She is alert.  Psychiatric:        Mood and Affect: Mood normal.     ED Results / Procedures / Treatments   Labs (all labs ordered are listed, but only abnormal results are displayed) Labs Reviewed  CBC WITH DIFFERENTIAL/PLATELET - Abnormal; Notable for the following components:      Result Value   WBC 14.5 (*)    Neutro Abs 12.2 (*)    All other components within normal limits  COMPREHENSIVE METABOLIC PANEL - Abnormal; Notable for the following components:   Glucose, Bld 151 (*)    Total Protein 8.2 (*)    All other components within normal limits  URINALYSIS, ROUTINE W REFLEX MICROSCOPIC - Abnormal;  Notable for the following components:   Glucose, UA 50 (*)    Hgb urine dipstick MODERATE (*)    Ketones, ur 5 (*)    All other components within normal limits  LIPASE, BLOOD    EKG None  Radiology US Abdomen Limited RUQ (LIVER/GB)  Result Date: 10/02/2021 CLINICAL DATA:  Pain EXAM: ULTRASOUND ABDOMEN LIMITED RIGHT UPPER QUADRANT COMPARISON:  None Available. FINDINGS: Gallbladder: Edematous and thickened gallbladder wall. Multiple gallstones, largest measures 1.1 cm. Positive sonographic Murphy sign noted by sonographer. Common bile duct: Diameter: 10 mm No intrahepatic biliary ductal dilation. Liver: No focal lesion identified. Within normal limits in parenchymal echogenicity. Portal vein is patent on color Doppler imaging with normal direction of blood flow towards the liver. Other: None. IMPRESSION: 1. Gallbladder wall thickening, gallstones and positive sonographic Murphy sign, findings are consistent with acute cholecystitis. 2. Dilated common bile duct, measuring up to 10 mm, concerning for choledocholithiasis. MRCP could be performed for further evaluation. Electronically Signed   By: Yetta Glassman M.D.   On: 10/02/2021 20:14   CT Abdomen Pelvis W Contrast  Result Date: 10/02/2021 CLINICAL DATA:  "Acute severe pancreatitis".  History of diabetes. EXAM: CT ABDOMEN AND PELVIS WITH CONTRAST TECHNIQUE: Multidetector CT imaging of the abdomen and pelvis was performed using the standard protocol following bolus administration of intravenous contrast. RADIATION DOSE REDUCTION: This exam was performed according to the departmental dose-optimization program which includes automated exposure control, adjustment of the mA and/or kV according to patient size and/or use of iterative reconstruction technique. CONTRAST:  189m OMNIPAQUE IOHEXOL 300 MG/ML  SOLN COMPARISON:  None Available. FINDINGS: Lower chest: Clear lung bases. Normal heart size without pericardial or pleural effusion. Hepatobiliary:  Subtle areas of hepatic hyperenhancement including within the high right hepatic lobe at 11 mm on 20/2 and within segment 2 at 9 mm on 24/2. Scattered tiny hepatic cysts. Multiple gallstones with possible gallbladder wall thickening including at 1.0 cm on 39/2. No biliary duct dilatation. Pancreas: The pancreas enhances homogeneously. No convincing evidence of peripancreatic edema. No duct dilatation. Spleen: Normal in size, without focal abnormality. Adrenals/Urinary Tract: Normal adrenal glands. Too small to characterize interpolar left renal lesion . In the absence of clinically indicated signs/symptoms require(s) no independent follow-up. Normal right kidney. No hydronephrosis. Normal urinary bladder. Stomach/Bowel: Normal stomach, without wall thickening. Extensive colonic diverticulosis. Normal terminal ileum and appendix. Normal small bowel. Vascular/Lymphatic: Aortic atherosclerosis. No abdominopelvic adenopathy. Reproductive: Uterine fibroids, including off the fundus with calcification at 2.4 cm. No adnexal mass. Other: No significant free fluid.  No free intraperitoneal air. Musculoskeletal: Degenerative changes of both hips. IMPRESSION: 1. No evidence of pancreatitis or its complications. 2. Cholelithiasis  with possible cholecystitis. Correlate with right upper quadrant symptoms and consider ultrasound 3. Multiple hyperenhancing foci within the liver, nonspecific. Correlate with liver disease and primary malignancy history. Consider nonemergent, outpatient pre and post contrast abdominal MRI. 4. Fibroid uterus 5.  Aortic Atherosclerosis (ICD10-I70.0). Electronically Signed   By: Abigail Miyamoto M.D.   On: 10/02/2021 18:02    Procedures Procedures    Medications Ordered in ED Medications  iohexol (OMNIPAQUE) 300 MG/ML solution 100 mL (has no administration in time range)  cefTRIAXone (ROCEPHIN) 2 g in sodium chloride 0.9 % 100 mL IVPB (2 g Intravenous New Bag/Given 10/02/21 2036)    And   metroNIDAZOLE (FLAGYL) IVPB 500 mg (500 mg Intravenous New Bag/Given 10/02/21 2038)  ondansetron (ZOFRAN-ODT) disintegrating tablet 8 mg (8 mg Oral Given 10/02/21 1356)  HYDROcodone-acetaminophen (NORCO/VICODIN) 5-325 MG per tablet 1 tablet (1 tablet Oral Given 10/02/21 1356)  sodium chloride 0.9 % bolus 1,000 mL (0 mLs Intravenous Stopped 10/02/21 1910)  dicyclomine (BENTYL) injection 20 mg (20 mg Intramuscular Given 10/02/21 1748)  iohexol (OMNIPAQUE) 300 MG/ML solution 100 mL (100 mLs Intravenous Contrast Given 10/02/21 1736)    ED Course/ Medical Decision Making/ A&P Clinical Course as of 10/02/21 2110  Thu Oct 02, 2021  2033 Consulted Dr. Marcello Moores of general surgery.  She agreed with admission of the patient and keeping the patient n.p.o. after midnight for evaluation in the morning.  Hospital medicine to admit. [CR]  2102 Consulted Dr. Marlowe Sax of hospital medicine.  She agreed with admission of the patient is seen for the treatment/care. [CR]    Clinical Course User Index [CR] Wilnette Kales, PA                           Medical Decision Making Amount and/or Complexity of Data Reviewed Labs: ordered. Radiology: ordered.  Risk Prescription drug management.   This patient presents to the ED for concern of abdominal pain, this involves an extensive number of treatment options, and is a complaint that carries with it a high risk of complications and morbidity.  The differential diagnosis includes cholecystitis, hepatitis, small bowel obstruction, appendicitis, choledocholithiasis, ascending cholangitis, pancreatitis, gastritis   Co morbidities that complicate the patient evaluation  See above   Additional history obtained:  Additional history obtained from EMR   Lab Tests:  I Ordered, and personally interpreted labs.  The pertinent results include: Leukocytosis with a WBC of 14.5.  No evidence of anemia.  Platelets within normal range.  No electrolyte abnormalities.  Renal  function within normal limits.  No elevation in hepatic function.  UA insignificant for urinary tract infection.  Lipase within normal range.   Imaging Studies ordered:  I ordered imaging studies including CT abdomen pelvis, ultrasound right upper quadrant. I independently visualized and interpreted imaging which showed  CT abdomen pelvis: No evidence of pancreatitis.  Cholelithiasis with possible cholecystitis.  Multiple hypoenhancing foci within the liver.  Fibroid uterus.  Aortic atherosclerosis. Right upper quadrant ultrasound: Gallbladder wall thickening, gallstones with positive sonographic Murphy sign.  Dilated common bile duct measuring up to 10 mm concerning for choledocholithiasis. I agree with the radiologist interpretation  Cardiac Monitoring: / EKG:  The patient was maintained on a cardiac monitor.  I personally viewed and interpreted the cardiac monitored which showed an underlying rhythm of: Sinus rhythm   Consultations Obtained:  See ED course  Problem List / ED Course / Critical interventions / Medication management  Cholecystitis/choledocholithiasis I ordered  medication including Flagyl and Rocephin for acute cholecystitis.  Zofran for nausea, Norco for pain, Bentyl for antispasmodic.  1 L normal saline for rehydration.    Reevaluation of the patient after these medicines showed that the patient improved I have reviewed the patients home medicines and have made adjustments as needed   Social Determinants of Health:  Denies tobacco, illicit drug use   Test / Admission - Considered:  Acute cholecystitis/choledocholithiasis Vitals signs significant for hypertension with a blood pressure 710 systolic.  Patient's blood pressure remained elevated on emergency department.  Patient made afebrile with no tachypnea or tachycardia.  She did not meet SIRS or sepsis criteria.. Otherwise within normal range and stable throughout visit. Laboratory/imaging studies significant  for: See above Patient symptoms likely secondary to acute cholecystitis/choledocholithiasis.  Patient to be admitted for further evaluation via surgery.  General surgery as well as hospital medicine were consulted regarding the patient and they agreed with admission and assume further treatment/care.  Treatment plan was discussed at length with patient and family and they acknowledge understanding and are agreeable to said plan.  Broad-spectrum antibiotics were begun while in the emergency department for appropriate abdominal coverage.  Patient was stable upon admission.         Final Clinical Impression(s) / ED Diagnoses Final diagnoses:  Acute cholecystitis  Choledocholithiasis  Uterine leiomyoma, unspecified location  Aortic atherosclerosis [facility])    Rx / DC Orders ED Discharge Orders     None         Wilnette Kales, [place] 10/02/21 2110    Ottie Glazier, DO 10/03/21 0018

## 2021-10-03 ENCOUNTER — Other Ambulatory Visit: Payer: Self-pay

## 2021-10-03 ENCOUNTER — Encounter (HOSPITAL_COMMUNITY)
Admission: EM | Disposition: A | Discharge status: Home / Self Care | Payer: Self-pay | Source: Home / Self Care | Attending: Internal Medicine

## 2021-10-03 ENCOUNTER — Inpatient Hospital Stay (HOSPITAL_COMMUNITY): Payer: No Typology Code available for payment source | Admitting: Anesthesiology

## 2021-10-03 ENCOUNTER — Encounter (HOSPITAL_COMMUNITY): Payer: Self-pay | Admitting: Internal Medicine

## 2021-10-03 DIAGNOSIS — E119 Type 2 diabetes mellitus without complications: Secondary | ICD-10-CM

## 2021-10-03 DIAGNOSIS — K810 Acute cholecystitis: Secondary | ICD-10-CM

## 2021-10-03 DIAGNOSIS — I10 Essential (primary) hypertension: Secondary | ICD-10-CM

## 2021-10-03 DIAGNOSIS — Z87891 Personal history of nicotine dependence: Secondary | ICD-10-CM

## 2021-10-03 DIAGNOSIS — K8010 Calculus of gallbladder with chronic cholecystitis without obstruction: Secondary | ICD-10-CM

## 2021-10-03 DIAGNOSIS — Z7984 Long term (current) use of oral hypoglycemic drugs: Secondary | ICD-10-CM

## 2021-10-03 HISTORY — PX: CHOLECYSTECTOMY: SHX55

## 2021-10-03 LAB — COMPREHENSIVE METABOLIC PANEL
ALT: 13 U/L (ref 0–44)
AST: 20 U/L (ref 15–41)
Albumin: 3.9 g/dL (ref 3.5–5.0)
Alkaline Phosphatase: 63 U/L (ref 38–126)
Anion gap: 8 (ref 5–15)
BUN: 7 mg/dL — ABNORMAL LOW (ref 8–23)
CO2: 24 mmol/L (ref 22–32)
Calcium: 9.2 mg/dL (ref 8.9–10.3)
Chloride: 107 mmol/L (ref 98–111)
Creatinine, Ser: 0.84 mg/dL (ref 0.44–1.00)
GFR, Estimated: >60 mL/min (ref >60)
Glucose, Bld: 122 mg/dL — ABNORMAL HIGH (ref 70–99)
Potassium: 3.6 mmol/L (ref 3.5–5.1)
Sodium: 139 mmol/L (ref 135–145)
Total Bilirubin: 0.7 mg/dL (ref 0.3–1.2)
Total Protein: 7 g/dL (ref 6.5–8.1)

## 2021-10-03 LAB — GLUCOSE, CAPILLARY
Glucose-Capillary: 129 mg/dL — ABNORMAL HIGH (ref 70–99)
Glucose-Capillary: 137 mg/dL — ABNORMAL HIGH (ref 70–99)
Glucose-Capillary: 159 mg/dL — ABNORMAL HIGH (ref 70–99)
Glucose-Capillary: 204 mg/dL — ABNORMAL HIGH (ref 70–99)
Glucose-Capillary: 207 mg/dL — ABNORMAL HIGH (ref 70–99)

## 2021-10-03 LAB — CBC
HCT: 40.9 % (ref 36.0–46.0)
Hemoglobin: 13.3 g/dL (ref 12.0–15.0)
MCH: 30.3 pg (ref 26.0–34.0)
MCHC: 32.5 g/dL (ref 30.0–36.0)
MCV: 93.2 fL (ref 80.0–100.0)
Platelets: 297 10*3/uL (ref 150–400)
RBC: 4.39 MIL/uL (ref 3.87–5.11)
RDW: 13.5 % (ref 11.5–15.5)
WBC: 16.2 10*3/uL — ABNORMAL HIGH (ref 4.0–10.5)
nRBC: 0 % (ref 0.0–0.2)

## 2021-10-03 LAB — HIV ANTIBODY (ROUTINE TESTING W REFLEX): HIV Screen 4th Generation wRfx: NONREACTIVE

## 2021-10-03 SURGERY — LAPAROSCOPIC CHOLECYSTECTOMY WITH INTRAOPERATIVE CHOLANGIOGRAM
Anesthesia: General | Site: Abdomen

## 2021-10-03 MED ORDER — DEXAMETHASONE SODIUM PHOSPHATE 10 MG/ML IJ SOLN
INTRAMUSCULAR | Status: DC | PRN
  Administered 2021-10-03: 10 mg via INTRAVENOUS

## 2021-10-03 MED ORDER — OXYCODONE HCL 5 MG/5ML PO SOLN: 5.0000 mg | Freq: Once | ORAL | Status: DC | PRN

## 2021-10-03 MED ORDER — ONDANSETRON HCL 4 MG/2ML IJ SOLN
INTRAMUSCULAR | Status: DC | PRN
  Administered 2021-10-03: 4 mg via INTRAVENOUS

## 2021-10-03 MED ORDER — ROCURONIUM BROMIDE 100 MG/10ML IV SOLN
INTRAVENOUS | Status: DC | PRN
  Administered 2021-10-03: 50 mg via INTRAVENOUS

## 2021-10-03 MED ORDER — PROPOFOL 10 MG/ML IV BOLUS
INTRAVENOUS | Status: DC | PRN
  Administered 2021-10-03: 30 mg via INTRAVENOUS
  Administered 2021-10-03: 140 mg via INTRAVENOUS
  Administered 2021-10-03: 30 mg via INTRAVENOUS

## 2021-10-03 MED ORDER — FENTANYL CITRATE (PF) 100 MCG/2ML IJ SOLN
INTRAMUSCULAR | Status: DC | PRN
Start: 2021-10-03 — End: 2021-10-03
  Administered 2021-10-03 (×4): 50 ug via INTRAVENOUS

## 2021-10-03 MED ORDER — OXYCODONE HCL 5 MG PO TABS: 5.0000 mg | ORAL_TABLET | Freq: Once | ORAL | Status: DC | PRN

## 2021-10-03 MED ORDER — MORPHINE SULFATE (PF) 2 MG/ML IV SOLN
1.0000 mg | INTRAVENOUS | Status: DC | PRN
  Administered 2021-10-03: 2 mg via INTRAVENOUS
  Filled 2021-10-03 (×2): qty 1

## 2021-10-03 MED ORDER — ROCURONIUM BROMIDE 10 MG/ML (PF) SYRINGE
PREFILLED_SYRINGE | INTRAVENOUS | Status: AC
  Filled 2021-10-03: qty 10

## 2021-10-03 MED ORDER — SUGAMMADEX SODIUM 200 MG/2ML IV SOLN
INTRAVENOUS | Status: DC | PRN
  Administered 2021-10-03: 200 mg via INTRAVENOUS

## 2021-10-03 MED ORDER — LABETALOL HCL 5 MG/ML IV SOLN
INTRAVENOUS | Status: AC
  Filled 2021-10-03: qty 4

## 2021-10-03 MED ORDER — LACTATED RINGERS IV SOLN: INTRAVENOUS | Status: DC

## 2021-10-03 MED ORDER — FENTANYL CITRATE (PF) 100 MCG/2ML IJ SOLN
INTRAMUSCULAR | Status: AC
  Filled 2021-10-03: qty 2

## 2021-10-03 MED ORDER — 0.9 % SODIUM CHLORIDE (POUR BTL) OPTIME
TOPICAL | Status: DC | PRN
  Administered 2021-10-03: 1000 mL

## 2021-10-03 MED ORDER — METOPROLOL TARTRATE 50 MG PO TABS
50.0000 mg | ORAL_TABLET | Freq: Once | ORAL | Status: AC
Start: 2021-10-03 — End: 2021-10-03
  Administered 2021-10-03: 50 mg via ORAL
  Filled 2021-10-03: qty 1

## 2021-10-03 MED ORDER — LACTATED RINGERS IR SOLN
Status: DC | PRN
  Administered 2021-10-03: 1000 mL

## 2021-10-03 MED ORDER — KETOROLAC TROMETHAMINE 30 MG/ML IJ SOLN: 30.0000 mg | Freq: Once | INTRAMUSCULAR | Status: DC | PRN

## 2021-10-03 MED ORDER — BUPIVACAINE-EPINEPHRINE (PF) 0.5% -1:200000 IJ SOLN
INTRAMUSCULAR | Status: DC | PRN
  Administered 2021-10-03: 30 mL

## 2021-10-03 MED ORDER — MIDAZOLAM HCL 5 MG/5ML IJ SOLN
INTRAMUSCULAR | Status: DC | PRN
  Administered 2021-10-03: 2 mg via INTRAVENOUS

## 2021-10-03 MED ORDER — CHLORHEXIDINE GLUCONATE 0.12 % MT SOLN
15.0000 mL | Freq: Once | OROMUCOSAL | Status: AC
  Administered 2021-10-03: 15 mL via OROMUCOSAL

## 2021-10-03 MED ORDER — PROPOFOL 10 MG/ML IV BOLUS
INTRAVENOUS | Status: AC
  Filled 2021-10-03: qty 20

## 2021-10-03 MED ORDER — ONDANSETRON HCL 4 MG/2ML IJ SOLN: 4.0000 mg | Freq: Once | INTRAMUSCULAR | Status: DC | PRN

## 2021-10-03 MED ORDER — LABETALOL HCL 5 MG/ML IV SOLN
5.0000 mg | Freq: Once | INTRAVENOUS | Status: AC | PRN
  Administered 2021-10-03: 5 mg via INTRAVENOUS

## 2021-10-03 MED ORDER — LABETALOL HCL 5 MG/ML IV SOLN
INTRAVENOUS | Status: DC | PRN
  Administered 2021-10-03 (×2): 5 mg via INTRAVENOUS

## 2021-10-03 MED ORDER — LACTATED RINGERS IV SOLN: INTRAVENOUS | Status: DC | PRN

## 2021-10-03 MED ORDER — HYDROMORPHONE HCL 1 MG/ML IJ SOLN: 0.2500 mg | INTRAMUSCULAR | Status: DC | PRN

## 2021-10-03 MED ORDER — METOPROLOL TARTRATE 50 MG PO TABS
50.0000 mg | ORAL_TABLET | Freq: Two times a day (BID) | ORAL | Status: DC
  Administered 2021-10-03 – 2021-10-04 (×2): 50 mg via ORAL
  Filled 2021-10-03 (×3): qty 1

## 2021-10-03 MED ORDER — DEXAMETHASONE SODIUM PHOSPHATE 10 MG/ML IJ SOLN
INTRAMUSCULAR | Status: AC
  Filled 2021-10-03: qty 1

## 2021-10-03 MED ORDER — LIDOCAINE 2% (20 MG/ML) 5 ML SYRINGE
INTRAMUSCULAR | Status: AC
  Filled 2021-10-03: qty 5

## 2021-10-03 MED ORDER — MIDAZOLAM HCL 2 MG/2ML IJ SOLN
INTRAMUSCULAR | Status: AC
  Filled 2021-10-03: qty 2

## 2021-10-03 MED ORDER — LIDOCAINE HCL (CARDIAC) PF 100 MG/5ML IV SOSY
PREFILLED_SYRINGE | INTRAVENOUS | Status: DC | PRN
  Administered 2021-10-03: 100 mg via INTRAVENOUS

## 2021-10-03 MED ORDER — ONDANSETRON HCL 4 MG/2ML IJ SOLN
INTRAMUSCULAR | Status: AC
  Filled 2021-10-03: qty 2

## 2021-10-03 MED ORDER — OXYCODONE HCL 5 MG PO TABS
5.0000 mg | ORAL_TABLET | ORAL | Status: DC | PRN
  Administered 2021-10-04: 5 mg via ORAL
  Filled 2021-10-03 (×2): qty 1

## 2021-10-03 SURGICAL SUPPLY — 34 items
APPLIER CLIP 5 13 M/L LIGAMAX5 (MISCELLANEOUS) ×1
BAG COUNTER SPONGE SURGICOUNT (BAG) IMPLANT
CABLE HIGH FREQUENCY MONO STRZ (ELECTRODE) ×1 IMPLANT
CATH REDDICK CHOLANGI 4FR 50CM (CATHETERS) ×1 IMPLANT
CHLORAPREP W/TINT 26 (MISCELLANEOUS) ×1 IMPLANT
CLIP APPLIE 5 13 M/L LIGAMAX5 (MISCELLANEOUS) ×1 IMPLANT
COVER MAYO STAND XLG (MISCELLANEOUS) ×1 IMPLANT
DERMABOND IMPLANT
DERMABOND ADVANCED (GAUZE/BANDAGES/DRESSINGS) ×1
DERMABOND ADVANCED .7 DNX12 (GAUZE/BANDAGES/DRESSINGS) ×1 IMPLANT
DRAPE C-ARM 42X120 X-RAY (DRAPES) ×1 IMPLANT
ELECT REM PT RETURN 15FT ADLT (MISCELLANEOUS) ×1 IMPLANT
GLOVE BIO SURGEON STRL SZ7.5 (GLOVE) ×1 IMPLANT
GOWN STRL REUS W/ TWL LRG LVL3 (GOWN DISPOSABLE) IMPLANT
GOWN STRL REUS W/TWL LRG LVL3 (GOWN DISPOSABLE)
HEMOSTAT SURGICEL 4X8 (HEMOSTASIS) IMPLANT
IRRIG SUCT STRYKERFLOW 2 WTIP (MISCELLANEOUS) ×1
IRRIGATION SUCT STRKRFLW 2 WTP (MISCELLANEOUS) ×1 IMPLANT
IV CATH 14GX2 1/4 (CATHETERS) ×1 IMPLANT
KIT BASIN OR (CUSTOM PROCEDURE TRAY) ×1 IMPLANT
KIT TURNOVER KIT A (KITS) IMPLANT
PENCIL SMOKE EVACUATOR (MISCELLANEOUS) IMPLANT
SCISSORS LAP 5X35 DISP (ENDOMECHANICALS) ×1 IMPLANT
SET TUBE SMOKE EVAC HIGH FLOW (TUBING) ×1 IMPLANT
SLEEVE Z-THREAD 5X100MM (TROCAR) ×2 IMPLANT
SPIKE FLUID TRANSFER (MISCELLANEOUS) ×1 IMPLANT
SUT MNCRL AB 4-0 PS2 18 (SUTURE) ×1 IMPLANT
SYS BAG RETRIEVAL 10MM (BASKET) ×2
SYSTEM BAG RETRIEVAL 10MM (BASKET) ×1 IMPLANT
TOWEL OR 17X26 10 PK STRL BLUE (TOWEL DISPOSABLE) ×1 IMPLANT
TOWEL OR NON WOVEN STRL DISP B (DISPOSABLE) ×1 IMPLANT
TRAY LAPAROSCOPIC (CUSTOM PROCEDURE TRAY) ×1 IMPLANT
TROCAR BALLN 12MMX100 BLUNT (TROCAR) ×1 IMPLANT
TROCAR Z-THREAD OPTICAL 5X100M (TROCAR) ×1 IMPLANT

## 2021-10-03 NOTE — Progress Notes (Signed)
  Transition of Care (TOC) Screening Note   Patient Details  Name: Raven Weaver Date of Birth: 1960-06-17   [facility] CM/SW Contact:    Roseanne Kaufman, RN Phone Number: 10/03/2021, 3:55 PM    Transition of Care Department [facility]) has reviewed patient and no TOC needs have been identified at this time. We will continue to monitor patient advancement through interdisciplinary progression rounds. If new patient transition needs arise, please place a TOC consult.

## 2021-10-03 NOTE — Anesthesia Procedure Notes (Signed)
Procedure Name: Intubation Date/Time: 10/03/2021 10:55 AM  Performed by: Mouna Yager, Forest Gleason, CRNAPre-anesthesia Checklist: Patient identified, Emergency Drugs available, Suction available, Patient being monitored and Timeout performed Patient Re-evaluated:Patient Re-evaluated prior to induction Oxygen Delivery Method: Circle system utilized Preoxygenation: Pre-oxygenation with 100% oxygen Induction Type: IV induction Ventilation: Mask ventilation without difficulty Laryngoscope Size: Mac and 4 Grade View: Grade III Tube type: Oral Tube size: 7.0 mm Number of attempts: 1 Airway Equipment and Method: Stylet Secured at: 21 cm Tube secured with: Tape Dental Injury: Teeth and Oropharynx as per pre-operative assessment

## 2021-10-03 NOTE — Progress Notes (Signed)
Subjective/Chief Complaint: Complains of RUQ pain   Objective: Vital signs in last 24 hours: Temp:  [98.5 F (36.9 C)-99.8 F (37.7 C)] 99.2 F (37.3 C) (08/18 0430) Pulse Rate:  [47-105] 105 (08/18 0430) Resp:  [17-22] 20 (08/18 0430) BP: (142-196)/(53-86) 148/54 (08/18 0430) SpO2:  [92 %-100 %] 97 % (08/18 0430) Weight:  [66.7 kg-67.7 kg] 67.7 kg (08/17 2223) Last BM Date : 10/01/21 (Per pt)  Intake/Output from previous day: 08/17 0701 - 08/18 0700 In: 595.3 [I.V.:595.3] Out: -  Intake/Output this shift: No intake/output data recorded.  General appearance: alert and cooperative Resp: clear to auscultation bilaterally Cardio: regular rate and rhythm with systolic murmur GI: soft, moderate tenderness RUQ  Lab Results:  Recent Labs    10/02/21 1425 10/03/21 0356  WBC 14.5* 16.2*  HGB 13.9 13.3  HCT 42.4 40.9  PLT 382 297   BMET Recent Labs    10/02/21 1425 10/03/21 0356  NA 136 139  K 3.7 3.6  CL 101 107  CO2 24 24  GLUCOSE 151* 122*  BUN 8 7*  CREATININE 0.68 0.84  CALCIUM 9.3 9.2   PT/INR No results for input(s): "LABPROT", "INR" in the last 72 hours. ABG No results for input(s): "PHART", "HCO3" in the last 72 hours.  Invalid input(s): "PCO2", "PO2"  Studies/Results: US Abdomen Limited RUQ (LIVER/GB)  Result Date: 10/02/2021 CLINICAL DATA:  Pain EXAM: ULTRASOUND ABDOMEN LIMITED RIGHT UPPER QUADRANT COMPARISON:  None Available. FINDINGS: Gallbladder: Edematous and thickened gallbladder wall. Multiple gallstones, largest measures 1.1 cm. Positive sonographic Murphy sign noted by sonographer. Common bile duct: Diameter: 10 mm No intrahepatic biliary ductal dilation. Liver: No focal lesion identified. Within normal limits in parenchymal echogenicity. Portal vein is patent on color Doppler imaging with normal direction of blood flow towards the liver. Other: None. IMPRESSION: 1. Gallbladder wall thickening, gallstones and positive sonographic Murphy  sign, findings are consistent with acute cholecystitis. 2. Dilated common bile duct, measuring up to 10 mm, concerning for choledocholithiasis. MRCP could be performed for further evaluation. Electronically Signed   By: Yetta Glassman M.D.   On: 10/02/2021 20:14   CT Abdomen Pelvis W Contrast  Result Date: 10/02/2021 CLINICAL DATA:  "Acute severe pancreatitis".  History of diabetes. EXAM: CT ABDOMEN AND PELVIS WITH CONTRAST TECHNIQUE: Multidetector CT imaging of the abdomen and pelvis was performed using the standard protocol following bolus administration of intravenous contrast. RADIATION DOSE REDUCTION: This exam was performed according to the departmental dose-optimization program which includes automated exposure control, adjustment of the mA and/or kV according to patient size and/or use of iterative reconstruction technique. CONTRAST:  185m OMNIPAQUE IOHEXOL 300 MG/ML  SOLN COMPARISON:  None Available. FINDINGS: Lower chest: Clear lung bases. Normal heart size without pericardial or pleural effusion. Hepatobiliary: Subtle areas of hepatic hyperenhancement including within the high right hepatic lobe at 11 mm on 20/2 and within segment 2 at 9 mm on 24/2. Scattered tiny hepatic cysts. Multiple gallstones with possible gallbladder wall thickening including at 1.0 cm on 39/2. No biliary duct dilatation. Pancreas: The pancreas enhances homogeneously. No convincing evidence of peripancreatic edema. No duct dilatation. Spleen: Normal in size, without focal abnormality. Adrenals/Urinary Tract: Normal adrenal glands. Too small to characterize interpolar left renal lesion . In the absence of clinically indicated signs/symptoms require(s) no independent follow-up. Normal right kidney. No hydronephrosis. Normal urinary bladder. Stomach/Bowel: Normal stomach, without wall thickening. Extensive colonic diverticulosis. Normal terminal ileum and appendix. Normal small bowel. Vascular/Lymphatic: Aortic atherosclerosis.  No abdominopelvic adenopathy.  Reproductive: Uterine fibroids, including off the fundus with calcification at 2.4 cm. No adnexal mass. Other: No significant free fluid.  No free intraperitoneal air. Musculoskeletal: Degenerative changes of both hips. IMPRESSION: 1. No evidence of pancreatitis or its complications. 2. Cholelithiasis with possible cholecystitis. Correlate with right upper quadrant symptoms and consider ultrasound 3. Multiple hyperenhancing foci within the liver, nonspecific. Correlate with liver disease and primary malignancy history. Consider nonemergent, outpatient pre and post contrast abdominal MRI. 4. Fibroid uterus 5.  Aortic Atherosclerosis (ICD10-I70.0). Electronically Signed   By: Abigail Miyamoto M.D.   On: 10/02/2021 18:02    Anti-infectives: Anti-infectives (From admission, onward)    Start     Dose/Rate Route Frequency Ordered Stop   10/03/21 2000  cefTRIAXone (ROCEPHIN) 2 g in sodium chloride 0.9 % 100 mL IVPB       See Hyperspace for full Linked Orders Report.   2 g 200 mL/hr over 30 Minutes Intravenous Every 24 hours 10/02/21 2304     10/03/21 0900  metroNIDAZOLE (FLAGYL) IVPB 500 mg       See Hyperspace for full Linked Orders Report.   500 mg 100 mL/hr over 60 Minutes Intravenous Every 12 hours 10/02/21 2304     10/02/21 2030  cefTRIAXone (ROCEPHIN) 2 g in sodium chloride 0.9 % 100 mL IVPB       See Hyperspace for full Linked Orders Report.   2 g 200 mL/hr over 30 Minutes Intravenous  Once 10/02/21 2020 10/02/21 2106   10/02/21 2030  metroNIDAZOLE (FLAGYL) IVPB 500 mg       See Hyperspace for full Linked Orders Report.   500 mg 100 mL/hr over 60 Minutes Intravenous  Once 10/02/21 2020 10/02/21 2138       Assessment/Plan: s/p Procedure(s): LAPAROSCOPIC CHOLECYSTECTOMY WITH INTRAOPERATIVE CHOLANGIOGRAM (N/A) Cholecystitis with cholelithiasis.  I feel she would benefit from having gallbladder removed. I have discussed with her in detail the risks and benefits of  the surgery as well as some of the technical aspects including cbd injury and she understands and wishes to proceed. Plan for lap chole with ioc today  LOS: 1 day    Autumn Messing III 10/03/2021

## 2021-10-03 NOTE — Op Note (Signed)
10/02/2021 - 10/03/2021  12:12 PM  PATIENT:  Raven Weaver  61 y.o. female  PRE-OPERATIVE DIAGNOSIS:  Cholecystitis with stones  POST-OPERATIVE DIAGNOSIS:  Cholecystitis with stones  PROCEDURE:  Procedure(s): LAPAROSCOPIC CHOLECYSTECTOMY (N/A)  SURGEON:  Surgeon(s) and Role:    * Jovita Kussmaul, MD - Primary  PHYSICIAN ASSISTANT:   ASSISTANTS: Melina Modena, PA   ANESTHESIA:   local and general  EBL:  20 mL   BLOOD ADMINISTERED:none  DRAINS: none   LOCAL MEDICATIONS USED:  MARCAINE     SPECIMEN:  Source of Specimen:  gallbladder  DISPOSITION OF SPECIMEN:  PATHOLOGY  COUNTS:  YES  TOURNIQUET:  * No tourniquets in log *  DICTATION: .Dragon Dictation    Procedure: After informed consent was obtained the patient was brought to the operating room and placed in the supine position on the operating room table. After adequate induction of general anesthesia the patient's abdomen was prepped with ChloraPrep allowed to dry and draped in usual sterile manner. An appropriate timeout was performed. The area below the umbilicus was infiltrated with quarter percent  Marcaine. A small incision was made with a 15 blade knife. The incision was carried down through the subcutaneous tissue bluntly with a hemostat and [employer] retractors. The linea alba was identified. The linea alba was incised with a 15 blade knife and each side was grasped with Coker clamps. The preperitoneal space was then probed with a hemostat until the peritoneum was opened and access was gained to the abdominal cavity. A 0 Vicryl pursestring stitch was placed in the fascia surrounding the opening. A Hassan cannula was then placed through the opening and anchored in place with the previously placed Vicryl purse string stitch. The abdomen was insufflated with carbon dioxide without difficulty. A laparoscope was inserted through the [facility] cannula in the right upper quadrant was inspected. Next the epigastric region was  infiltrated with % Marcaine. A small incision was made with a 15 blade knife. A 5 mm port was placed bluntly through this incision into the abdominal cavity under direct vision. Next 2 sites were chosen laterally on the right side of the abdomen for placement of 5 mm ports. Each of these areas was infiltrated with quarter percent Marcaine. Small stab incisions were made with a 15 blade knife. 5 mm ports were then placed bluntly through these incisions into the abdominal cavity under direct vision without difficulty.  The gallbladder was severely inflamed and thick-walled.  It had to be aspirated with a nijat aspirator so that the wall could be grasped.  A blunt grasper was placed through the lateralmost 5 mm port and used to grasp the dome of the gallbladder and elevate it anteriorly and superiorly. Another blunt grasper was placed through the other 5 mm port and used to retract the body and neck of the gallbladder. A dissector was placed through the epigastric port and using the electrocautery the peritoneal reflection at the gallbladder neck was opened. Blunt dissection was then carried out in this area until the gallbladder neck-cystic duct junction was readily identified and a good window was created.  The area was very inflamed but we could see the anatomy well.  Since her liver functions were normal we elected not to do a cholangiogram because of the amount of inflammation in the area.  2 clips were placed proximally 1 distally on the cystic duct and the duct was divided between the 2 sets of clips.  Posterior to this the cystic artery was  identified and again dissected bluntly in a circumferential manner until a good window  was created. 2 clips were placed proximally and one distally on the artery and the artery was divided between the 2 sets of clips. Next a laparoscopic hook cautery device was used to separate the gallbladder from the liver bed. Prior to completely detaching the gallbladder from the liver  bed the liver bed was inspected and several small bleeding points were coagulated with the electrocautery until the area was completely hemostatic. The gallbladder was then detached the rest of it from the liver bed without difficulty. A laparoscopic bag was inserted through the hassan port. The laparoscope was moved to the epigastric port. The gallbladder was placed within the bag and the bag was sealed.  The bag with the gallbladder was then removed with the [facility] cannula through the infraumbilical port without difficulty. The fascial defect was then closed with the previously placed Vicryl pursestring stitch as well as with another figure-of-eight 0 Vicryl stitch. The liver bed was inspected again and found to be hemostatic. The abdomen was irrigated with copious amounts of saline until the effluent was clear. The ports were then removed under direct vision without difficulty and were found to be hemostatic. The gas was allowed to escape. No other abnormalities were noted on general inspection of the abdomen. The skin incisions were all closed with interrupted 4-0 Monocryl subcuticular stitches. Dermabond dressings were applied. The patient tolerated the procedure well. At the end of the case all needle sponge and instrument counts were correct. The patient was then awakened and taken to recovery in stable condition   PLAN OF CARE: Admit for overnight observation  PATIENT DISPOSITION:  PACU - hemodynamically stable.   Delay start of Pharmacological VTE agent (>24hrs) due to surgical blood loss or risk of bleeding: no

## 2021-10-03 NOTE — Plan of Care (Signed)
  Problem: Education: Goal: Knowledge of General Education information will improve Description: Including pain rating scale, medication(s)/side effects and non-pharmacologic comfort measures Outcome: Progressing   Problem: Health Behavior/Discharge Planning: Goal: Ability to manage health-related needs will improve Outcome: Progressing   Problem: Clinical Measurements: Goal: Ability to maintain clinical measurements within normal limits will improve Outcome: Progressing Goal: Diagnostic test results will improve Outcome: Progressing Goal: Cardiovascular complication will be avoided Outcome: Progressing   Problem: Activity: Goal: Risk for activity intolerance will decrease Outcome: Progressing   Problem: Coping: Goal: Level of anxiety will decrease Outcome: Progressing   Problem: Elimination: Goal: Will not experience complications related to bowel motility Outcome: Progressing Goal: Will not experience complications related to urinary retention Outcome: Progressing   Problem: Pain Managment: Goal: General experience of comfort will improve Outcome: Progressing   Problem: Safety: Goal: Ability to remain free from injury will improve Outcome: Progressing   Problem: Skin Integrity: Goal: Risk for impaired skin integrity will decrease Outcome: Progressing

## 2021-10-03 NOTE — Anesthesia Postprocedure Evaluation (Signed)
Anesthesia Post Note  Patient: Raven Weaver  Procedure(s) Performed: LAPAROSCOPIC CHOLECYSTECTOMY (Abdomen)     Patient location during evaluation: PACU Anesthesia Type: General Level of consciousness: awake and alert Pain management: pain level controlled Vital Signs Assessment: post-procedure vital signs reviewed and stable Respiratory status: spontaneous breathing, nonlabored ventilation and respiratory function stable Cardiovascular status: blood pressure returned to baseline Postop Assessment: no apparent nausea or vomiting Anesthetic complications: no Comments: Patient seen in PACU for brief episodes of SVT, rates 160s, noted by RN. On my arrival, patient with sinus tachycardia, rate low 100s. Patient currently asymptomatic, denies CP, SOB, diaphoresis, palpitations, nausea. She had previously received labetalol '5mg'$  IV for SBP 170s approximately 30 minutes ago. BP currently 147/74. She does note that she has history of SVT for which she has recently seen a cardiologist and was started on metoprolol '50mg'$  BID. I note that she has missed her doses since she was admitted to the hospital yesterday. I have ordered her home dose to be given prior to transfer to floor. She will be on telemetry when she leaves PACU. Daiva Huge, MD   No notable events documented.  Last Vitals:  Vitals:   10/03/21 1230 10/03/21 1245  BP: (!) 171/83 (!) 147/74  Pulse: (!) 106 (!) 101  Resp: 16 16  Temp: 37.3 C   SpO2: 100% 100%    Last Pain:  Vitals:   10/03/21 1245  TempSrc:   PainSc: 0-No pain                 Marthenia Rolling

## 2021-10-03 NOTE — Anesthesia Preprocedure Evaluation (Addendum)
Anesthesia Evaluation  Patient identified by MRN, date of birth, ID band Patient awake    Reviewed: Allergy & Precautions, NPO status , Patient's Chart, lab work & pertinent test results  Airway Mallampati: II  TM Distance: >3 FB Neck ROM: Full    Dental no notable dental hx. (+) Teeth Intact, Dental Advisory Given   Pulmonary former smoker,    Pulmonary exam normal breath sounds clear to auscultation       Cardiovascular hypertension, Normal cardiovascular exam+ dysrhythmias Supra Ventricular Tachycardia  Rhythm:Regular Rate:Normal     Neuro/Psych    GI/Hepatic GERD  ,  Endo/Other  diabetes  Renal/GU      Musculoskeletal   Abdominal   Peds  Hematology   Anesthesia Other Findings   Reproductive/Obstetrics                            Anesthesia Physical Anesthesia Plan  ASA: 3 and emergent  Anesthesia Plan: General   Post-op Pain Management:    Induction: Intravenous  PONV Risk Score and Plan: 4 or greater and Midazolam, Ondansetron and Treatment may vary due to age or medical condition  Airway Management Planned: Oral ETT  Additional Equipment: None  Intra-op Plan:   Post-operative Plan: Extubation in OR  Informed Consent: I have reviewed the patients History and Physical, chart, labs and discussed the procedure including the risks, benefits and alternatives for the proposed anesthesia with the patient or authorized representative who has indicated his/her understanding and acceptance.     Dental advisory given  Plan Discussed with:   Anesthesia Plan Comments:         Anesthesia Quick Evaluation

## 2021-10-03 NOTE — Discharge Instructions (Signed)
[facility] LAPAROSCOPIC SURGERY: POST OP INSTRUCTIONS Always review your discharge instruction sheet given to you by the facility where your surgery was performed. IF YOU HAVE DISABILITY OR FAMILY LEAVE FORMS, YOU MUST BRING THEM TO THE OFFICE FOR PROCESSING.   DO NOT GIVE THEM TO YOUR DOCTOR.  PAIN CONTROL  First take acetaminophen (Tylenol) AND/or ibuprofen (Advil) to control your pain after surgery.  Follow directions on package.  Taking acetaminophen (Tylenol) and/or ibuprofen (Advil) regularly after surgery will help to control your pain and lower the amount of prescription pain medication you may need.  You should not take more than 3,000 mg (3 grams) of acetaminophen (Tylenol) in 24 hours.  You should not take ibuprofen (Advil), aleve, motrin, naprosyn or other NSAIDS if you have a history of stomach ulcers or chronic kidney disease.  A prescription for pain medication may be given to you upon discharge.  Take your pain medication as prescribed, if you still have uncontrolled pain after taking acetaminophen (Tylenol) or ibuprofen (Advil). Use ice packs to help control pain. If you need a refill on your pain medication, please contact your pharmacy.  They will contact our office to request authorization. Prescriptions will not be filled after 5pm or on week-ends.  HOME MEDICATIONS Take your usually prescribed medications unless otherwise directed.  DIET You should follow a light diet the first few days after arrival home.  Be sure to include lots of fluids daily. Avoid fatty, fried foods.   CONSTIPATION It is common to experience some constipation after surgery and if you are taking pain medication.  Increasing fluid intake and taking a stool softener (such as Colace) will usually help or prevent this problem from occurring.  A mild laxative (Milk of Magnesia or Miralax) should be taken according to package instructions if there are no bowel movements after 48  hours.  WOUND/INCISION CARE Most patients will experience some swelling and bruising in the area of the incisions.  Ice packs will help.  Swelling and bruising can take several days to resolve.  Unless discharge instructions indicate otherwise, follow guidelines below  STERI-STRIPS - you may remove your outer bandages 48 hours after surgery, and you may shower at that time.  You have steri-strips (small skin tapes) in place directly over the incision.  These strips should be left on the skin for 7-10 days.   DERMABOND/SKIN GLUE - you may shower in 24 hours.  The glue will flake off over the next 2-3 weeks. Any sutures or staples will be removed at the office during your follow-up visit.  ACTIVITIES You may resume regular (light) daily activities beginning the next day--such as daily self-care, walking, climbing stairs--gradually increasing activities as tolerated.  You may have sexual intercourse when it is comfortable.  Refrain from any heavy lifting or straining until approved by your doctor. You may drive when you are no longer taking prescription pain medication, you can comfortably wear a seatbelt, and you can safely maneuver your car and apply brakes.  FOLLOW-UP You should see your doctor in the office for a follow-up appointment approximately 2-3 weeks after your surgery.  You should have been given your post-op/follow-up appointment when your surgery was scheduled.  If you did not receive a post-op/follow-up appointment, make sure that you call for this appointment within a day or two after you arrive home to insure a convenient appointment time.   WHEN TO CALL YOUR DOCTOR: Fever over 101.0 Inability to urinate Continued bleeding from incision.   Increased pain, redness, or drainage from the incision. Increasing abdominal pain  The clinic staff is available to answer your questions during regular business hours.  Please don't hesitate to call and ask to speak to one of the nurses for  clinical concerns.  If you have a medical emergency, go to the nearest emergency room or call 911.  A surgeon from [facility] is always on call at the hospital. [address], [address], [place], [place]  [address] ? [address] [address], [place], [place]   [address] [phone] ? [phone] ? FAX [phone] Web site: [email or web address]  

## 2021-10-03 NOTE — Transfer of Care (Signed)
Immediate Anesthesia Transfer of Care Note  Patient: Raven Weaver  Procedure(s) Performed: LAPAROSCOPIC CHOLECYSTECTOMY (Abdomen)  Patient Location: PACU  Anesthesia Type:General  Level of Consciousness: awake  Airway & Oxygen Therapy: Patient Spontanous Breathing  Post-op Assessment: Report given to RN  Post vital signs: stable  Last Vitals:  Vitals Value Taken Time  BP 171/83 10/03/21 1231  Temp 37.3 C 10/03/21 1230  Pulse 102 10/03/21 1236  Resp 15 10/03/21 1236  SpO2 100 % 10/03/21 1236  Vitals shown include unvalidated device data.  Last Pain:  Vitals:   10/03/21 0953  TempSrc:   PainSc: 7       Patients Stated Pain Goal: 6 (27/61/84 8592)  Complications: No notable events documented.

## 2021-10-03 NOTE — Progress Notes (Signed)
[facility] PROGRESS NOTE   ICYSS SKOG HQP:[id number] DOB: 12-29-1960 DOA: 10/02/2021  PCP: Billie Ruddy, MD  Brief History/Interval Summary: 61 y.o. female with medical history significant for hypertension, type 2 diabetes, GERD, nephrolithiasis, SVT, PVCs presented to the ED with complaints of epigastric abdominal pain, nausea, and vomiting.  Imaging studies raised concern for acute cholecystitis.  There was also some concern for choledocholithiasis.  Patient was hospitalized for further management.   Consultants: General surgery  Procedures: Plan is for laparoscopic cholecystectomy today    Subjective/Interval History: Patient complains of "soreness" in her upper abdomen.  Some nausea but no vomiting.  Denies any shortness of breath or chest pains.    Assessment/Plan:  Acute cholecystitis in the setting of cholelithiasis There was also some concern for choledocholithiasis.  LFTs however were unremarkable.  WBC was noted to be elevated.  Patient was started on ceftriaxone and metronidazole. General surgery has seen the patient.  Plan is for laparoscopic cholecystectomy and intraoperative cholangiogram today. Pain control.  Further management per general surgery.  Abnormal appearance of liver Concern for multiple hyperenhancing foci within the liver on CT scan.  Ultrasound did not show any focal lesions.  Will need MRI at some point in time in the near future.  This can be pursued in the outpatient setting.  Essential hypertension/history of PACs Stable.  Elevated blood pressure readings could also be from pain issues.  Resume her metoprolol for now.  Also noted to be on lisinopril prior to admission.  Diabetes mellitus type 2, controlled On metformin prior to admission.  Continue SSI.  HbA1c was 5.9 in March.  DVT Prophylaxis: Definitive prophylaxis after surgery. Code Status: Full code Family Communication: Discussed with patient Disposition Plan: To be  determined  Status is: Inpatient Remains inpatient appropriate because: Acute cholecystitis      Medications: Scheduled:  [MAR Hold] insulin aspart  0-9 Units Subcutaneous Q4H   Continuous:  sodium chloride 125 mL/hr at 10/03/21 0547   [MAR Hold] cefTRIAXone (ROCEPHIN)  IV     And   [MAR Hold] metronidazole 500 mg (10/03/21 0850)   lactated ringers 10 mL/hr at 10/03/21 0955   PRN:[MAR Hold] acetaminophen **OR** [MAR Hold] acetaminophen, [MAR Hold] hydrALAZINE, [MAR Hold]  morphine injection, [MAR Hold] ondansetron (ZOFRAN) IV  Antibiotics: Anti-infectives (From admission, onward)    Start     Dose/Rate Route Frequency Ordered Stop   10/03/21 2000  [MAR Hold]  cefTRIAXone (ROCEPHIN) 2 g in sodium chloride 0.9 % 100 mL IVPB        (MAR Hold since Fri 10/03/2021 at 0949.Hold Reason: Transfer to a Procedural area)  See Hyperspace for full Linked Orders Report.   2 g 200 mL/hr over 30 Minutes Intravenous Every 24 hours 10/02/21 2304     10/03/21 0900  [MAR Hold]  metroNIDAZOLE (FLAGYL) IVPB 500 mg        (MAR Hold since Fri 10/03/2021 at 0949.Hold Reason: Transfer to a Procedural area)  See Hyperspace for full Linked Orders Report.   500 mg 100 mL/hr over 60 Minutes Intravenous Every 12 hours 10/02/21 2304     10/02/21 2030  cefTRIAXone (ROCEPHIN) 2 g in sodium chloride 0.9 % 100 mL IVPB       See Hyperspace for full Linked Orders Report.   2 g 200 mL/hr over 30 Minutes Intravenous  Once 10/02/21 2020 10/02/21 2106   10/02/21 2030  metroNIDAZOLE (FLAGYL) IVPB 500 mg       See Hyperspace for  full Linked Orders Report.   500 mg 100 mL/hr over 60 Minutes Intravenous  Once 10/02/21 2020 10/02/21 2138       Objective:  Vital Signs  Vitals:   10/02/21 2220 10/02/21 2223 10/03/21 0100 10/03/21 0430  BP: (!) 187/53  (!) 142/61 (!) 148/54  Pulse: (!) 53  96 (!) 105  Resp: '20  17 20  '$ Temp: 99.4 F (37.4 C)  99.8 F (37.7 C) 99.2 F (37.3 C)  TempSrc: Oral  Oral Oral  SpO2:  100%   97%  Weight:  67.7 kg    Height:  '5\' 4"'$  (1.626 m)      Intake/Output Summary (Last 24 hours) at 10/03/2021 1027 Last data filed at 10/03/2021 0547 Gross per 24 hour  Intake 595.33 ml  Output --  Net 595.33 ml   Filed Weights   10/02/21 1350 10/02/21 2223  Weight: 66.7 kg 67.7 kg    General appearance: Awake alert.  In no distress Resp: Clear to auscultation bilaterally.  Normal effort Cardio: S1-S2 is normal regular.  No S3-S4.  No rubs murmurs or bruit GI: Abdomen is soft.  Tender in epigastric area along with right upper quadrant.  Murphy sign is positive. Extremities: No edema.  Full range of motion of lower extremities. Neurologic: Alert and oriented x3.  No focal neurological deficits.    Lab Results:  Data Reviewed: I have personally reviewed following labs and reports of the imaging studies  CBC: Recent Labs  Lab 10/02/21 1425 10/03/21 0356  WBC 14.5* 16.2*  NEUTROABS 12.2*  --   HGB 13.9 13.3  HCT 42.4 40.9  MCV 92.8 93.2  PLT 382 094    Basic Metabolic Panel: Recent Labs  Lab 10/02/21 1425 10/03/21 0356  NA 136 139  K 3.7 3.6  CL 101 107  CO2 24 24  GLUCOSE 151* 122*  BUN 8 7*  CREATININE 0.68 0.84  CALCIUM 9.3 9.2    GFR: Estimated Creatinine Clearance: 65.7 mL/min (by C-G formula based on SCr of 0.84 mg/dL).  Liver Function Tests: Recent Labs  Lab 10/02/21 1425 10/03/21 0356  AST 21 20  ALT 14 13  ALKPHOS 76 63  BILITOT 0.5 0.7  PROT 8.2* 7.0  ALBUMIN 4.6 3.9    Recent Labs  Lab 10/02/21 1425  LIPASE 30     CBG: Recent Labs  Lab 10/02/21 2336 10/03/21 0427 10/03/21 0733  GLUCAP 148* 137* 129*      Radiology Studies: US Abdomen Limited RUQ (LIVER/GB)  Result Date: 10/02/2021 CLINICAL DATA:  Pain EXAM: ULTRASOUND ABDOMEN LIMITED RIGHT UPPER QUADRANT COMPARISON:  None Available. FINDINGS: Gallbladder: Edematous and thickened gallbladder wall. Multiple gallstones, largest measures 1.1 cm. Positive sonographic  Murphy sign noted by sonographer. Common bile duct: Diameter: 10 mm No intrahepatic biliary ductal dilation. Liver: No focal lesion identified. Within normal limits in parenchymal echogenicity. Portal vein is patent on color Doppler imaging with normal direction of blood flow towards the liver. Other: None. IMPRESSION: 1. Gallbladder wall thickening, gallstones and positive sonographic Murphy sign, findings are consistent with acute cholecystitis. 2. Dilated common bile duct, measuring up to 10 mm, concerning for choledocholithiasis. MRCP could be performed for further evaluation. Electronically Signed   By: Yetta Glassman M.D.   On: 10/02/2021 20:14   CT Abdomen Pelvis W Contrast  Result Date: 10/02/2021 CLINICAL DATA:  "Acute severe pancreatitis".  History of diabetes. EXAM: CT ABDOMEN AND PELVIS WITH CONTRAST TECHNIQUE: Multidetector CT imaging of the abdomen and pelvis was  performed using the standard protocol following bolus administration of intravenous contrast. RADIATION DOSE REDUCTION: This exam was performed according to the departmental dose-optimization program which includes automated exposure control, adjustment of the mA and/or kV according to patient size and/or use of iterative reconstruction technique. CONTRAST:  179m OMNIPAQUE IOHEXOL 300 MG/ML  SOLN COMPARISON:  None Available. FINDINGS: Lower chest: Clear lung bases. Normal heart size without pericardial or pleural effusion. Hepatobiliary: Subtle areas of hepatic hyperenhancement including within the high right hepatic lobe at 11 mm on 20/2 and within segment 2 at 9 mm on 24/2. Scattered tiny hepatic cysts. Multiple gallstones with possible gallbladder wall thickening including at 1.0 cm on 39/2. No biliary duct dilatation. Pancreas: The pancreas enhances homogeneously. No convincing evidence of peripancreatic edema. No duct dilatation. Spleen: Normal in size, without focal abnormality. Adrenals/Urinary Tract: Normal adrenal glands. Too  small to characterize interpolar left renal lesion . In the absence of clinically indicated signs/symptoms require(s) no independent follow-up. Normal right kidney. No hydronephrosis. Normal urinary bladder. Stomach/Bowel: Normal stomach, without wall thickening. Extensive colonic diverticulosis. Normal terminal ileum and appendix. Normal small bowel. Vascular/Lymphatic: Aortic atherosclerosis. No abdominopelvic adenopathy. Reproductive: Uterine fibroids, including off the fundus with calcification at 2.4 cm. No adnexal mass. Other: No significant free fluid.  No free intraperitoneal air. Musculoskeletal: Degenerative changes of both hips. IMPRESSION: 1. No evidence of pancreatitis or its complications. 2. Cholelithiasis with possible cholecystitis. Correlate with right upper quadrant symptoms and consider ultrasound 3. Multiple hyperenhancing foci within the liver, nonspecific. Correlate with liver disease and primary malignancy history. Consider nonemergent, outpatient pre and post contrast abdominal MRI. 4. Fibroid uterus 5.  Aortic Atherosclerosis (ICD10-I70.0). Electronically Signed   By: KAbigail MiyamotoM.D.   On: 10/02/2021 18:02       LOS: 1 day   [facility] Pager on [email or web address]  10/03/2021, 10:27 AM

## 2021-10-04 ENCOUNTER — Encounter (HOSPITAL_COMMUNITY): Payer: Self-pay | Admitting: General Surgery

## 2021-10-04 LAB — COMPREHENSIVE METABOLIC PANEL
ALT: 21 U/L (ref 0–44)
AST: 29 U/L (ref 15–41)
Albumin: 3.4 g/dL — ABNORMAL LOW (ref 3.5–5.0)
Alkaline Phosphatase: 54 U/L (ref 38–126)
Anion gap: 8 (ref 5–15)
BUN: 10 mg/dL (ref 8–23)
CO2: 24 mmol/L (ref 22–32)
Calcium: 9.1 mg/dL (ref 8.9–10.3)
Chloride: 104 mmol/L (ref 98–111)
Creatinine, Ser: 0.84 mg/dL (ref 0.44–1.00)
GFR, Estimated: >60 mL/min (ref >60)
Glucose, Bld: 126 mg/dL — ABNORMAL HIGH (ref 70–99)
Potassium: 4 mmol/L (ref 3.5–5.1)
Sodium: 136 mmol/L (ref 135–145)
Total Bilirubin: 0.6 mg/dL (ref 0.3–1.2)
Total Protein: 6.6 g/dL (ref 6.5–8.1)

## 2021-10-04 LAB — CBC
HCT: 34.7 % — ABNORMAL LOW (ref 36.0–46.0)
Hemoglobin: 11.2 g/dL — ABNORMAL LOW (ref 12.0–15.0)
MCH: 30.7 pg (ref 26.0–34.0)
MCHC: 32.3 g/dL (ref 30.0–36.0)
MCV: 95.1 fL (ref 80.0–100.0)
Platelets: 306 10*3/uL (ref 150–400)
RBC: 3.65 MIL/uL — ABNORMAL LOW (ref 3.87–5.11)
RDW: 13.6 % (ref 11.5–15.5)
WBC: 22.6 10*3/uL — ABNORMAL HIGH (ref 4.0–10.5)
nRBC: 0 % (ref 0.0–0.2)

## 2021-10-04 LAB — GLUCOSE, CAPILLARY
Glucose-Capillary: 122 mg/dL — ABNORMAL HIGH (ref 70–99)
Glucose-Capillary: 114 mg/dL — ABNORMAL HIGH (ref 70–99)
Glucose-Capillary: 159 mg/dL — ABNORMAL HIGH (ref 70–99)

## 2021-10-04 MED ORDER — OXYCODONE HCL 5 MG PO TABS: 5.0000 mg | ORAL_TABLET | ORAL | 0 refills | Status: DC | PRN

## 2021-10-04 MED ORDER — METOPROLOL TARTRATE 50 MG PO TABS: 50.0000 mg | ORAL_TABLET | Freq: Two times a day (BID) | ORAL | 3 refills | Status: DC

## 2021-10-04 MED ORDER — ACETAMINOPHEN 500 MG PO TABS: 500.0000 mg | ORAL_TABLET | Freq: Four times a day (QID) | ORAL | Status: DC | PRN

## 2021-10-04 MED ORDER — LISINOPRIL 10 MG PO TABS: 10.0000 mg | ORAL_TABLET | Freq: Every day | ORAL | 3 refills | Status: DC

## 2021-10-04 NOTE — Plan of Care (Signed)
  Problem: Activity: Goal: Risk for activity intolerance will decrease Outcome: Progressing   Problem: Nutrition: Goal: Adequate nutrition will be maintained Outcome: Progressing   Problem: Elimination: Goal: Will not experience complications related to bowel motility Outcome: Progressing Goal: Will not experience complications related to urinary retention Outcome: Progressing   Problem: Pain Managment: Goal: General experience of comfort will improve Outcome: Progressing   

## 2021-10-04 NOTE — Plan of Care (Signed)
  Problem: Education: Goal: Knowledge of General Education information will improve Description: Including pain rating scale, medication(s)/side effects and non-pharmacologic comfort measures Outcome: Completed/Met   Problem: Health Behavior/Discharge Planning: Goal: Ability to manage health-related needs will improve 10/04/2021 1258 by Annie Sable, RN Outcome: Completed/Met 10/04/2021 0952 by Annie Sable, RN Outcome: Progressing   Problem: Clinical Measurements: Goal: Ability to maintain clinical measurements within normal limits will improve Outcome: Completed/Met Goal: Will remain free from infection Outcome: Completed/Met Goal: Diagnostic test results will improve Outcome: Completed/Met Goal: Respiratory complications will improve Outcome: Completed/Met Goal: Cardiovascular complication will be avoided Outcome: Completed/Met   Problem: Activity: Goal: Risk for activity intolerance will decrease Outcome: Completed/Met   Problem: Nutrition: Goal: Adequate nutrition will be maintained Outcome: Completed/Met   Problem: Coping: Goal: Level of anxiety will decrease Outcome: Completed/Met   Problem: Elimination: Goal: Will not experience complications related to bowel motility 10/04/2021 1258 by Annie Sable, RN Outcome: Completed/Met 10/04/2021 0952 by Annie Sable, RN Outcome: Adequate for Discharge Goal: Will not experience complications related to urinary retention Outcome: Completed/Met   Problem: Pain Managment: Goal: General experience of comfort will improve 10/04/2021 1258 by Annie Sable, RN Outcome: Completed/Met 10/04/2021 0952 by Annie Sable, RN Outcome: Adequate for Discharge   Problem: Safety: Goal: Ability to remain free from injury will improve Outcome: Completed/Met   Problem: Skin Integrity: Goal: Risk for impaired skin integrity will decrease 10/04/2021 1258 by Annie Sable, RN Outcome: Completed/Met 10/04/2021 0952 by Annie Sable, RN Outcome: Adequate for Discharge   Problem: Education: Goal: Ability to describe self-care measures that may prevent or decrease complications (Diabetes Survival Skills Education) will improve Outcome: Completed/Met   Problem: Coping: Goal: Ability to adjust to condition or change in health will improve Outcome: Completed/Met   Problem: Fluid Volume: Goal: Ability to maintain a balanced intake and output will improve Outcome: Completed/Met   Problem: Health Behavior/Discharge Planning: Goal: Ability to identify and utilize available resources and services will improve 10/04/2021 1258 by Annie Sable, RN Outcome: Completed/Met 10/04/2021 0952 by Annie Sable, RN Outcome: Progressing Goal: Ability to manage health-related needs will improve 10/04/2021 1258 by Annie Sable, RN Outcome: Completed/Met 10/04/2021 0952 by Annie Sable, RN Outcome: Progressing   Problem: Metabolic: Goal: Ability to maintain appropriate glucose levels will improve Outcome: Completed/Met   Problem: Nutritional: Goal: Maintenance of adequate nutrition will improve Outcome: Completed/Met Goal: Progress toward achieving an optimal weight will improve Outcome: Completed/Met   Problem: Skin Integrity: Goal: Risk for impaired skin integrity will decrease Outcome: Completed/Met   Problem: Tissue Perfusion: Goal: Adequacy of tissue perfusion will improve Outcome: Completed/Met   Problem: Clinical Measurements: Goal: Ability to maintain clinical measurements within normal limits will improve 10/04/2021 1258 by Annie Sable, RN Outcome: Completed/Met 10/04/2021 0952 by Annie Sable, RN Outcome: Adequate for Discharge Goal: Postoperative complications will be avoided or minimized 10/04/2021 1258 by Annie Sable, RN Outcome: Completed/Met 10/04/2021 0952 by Annie Sable, RN Outcome: Adequate for Discharge   Problem: Skin Integrity: Goal: Demonstration of wound healing  without infection will improve 10/04/2021 1258 by Annie Sable, RN Outcome: Completed/Met 10/04/2021 0952 by Annie Sable, RN Outcome: Adequate for Discharge   Problem: Education: Goal: Individualized Educational Video(s) Outcome: Not Applicable   Problem: Education: Goal: Required Educational Video(s) Outcome: Not Applicable

## 2021-10-04 NOTE — Discharge Summary (Signed)
[facility]  Physician Discharge Summary   Patient ID: CYANA SHOOK MRN: [id number] DOB/AGE: 62-Aug-1962 62 y.o.  Admit date: 10/02/2021 Discharge date: 10/04/2021    PCP: Billie Ruddy, MD  DISCHARGE DIAGNOSES:  Principal Problem:   Acute cholecystitis Active Problems:   Essential hypertension   Type 2 diabetes mellitus without complication, without long-term current use of insulin (HCC)   RECOMMENDATIONS FOR OUTPATIENT FOLLOW UP: General surgery to arrange outpatient follow-up Consider MRI abdomen as an outpatient to further evaluate liver lesions noted on CT scan though not seen on ultrasound   Home Health: None Equipment/Devices: None  CODE STATUS: Full code  DISCHARGE CONDITION: fair  Diet recommendation: As recommended by general surgery  INITIAL HISTORY:  61 y.o. female with medical history significant for hypertension, type 2 diabetes, GERD, nephrolithiasis, SVT, PVCs presented to the ED with complaints of epigastric abdominal pain, nausea, and vomiting.  Imaging studies raised concern for acute cholecystitis.  There was also some concern for choledocholithiasis.  Patient was hospitalized for further management.    Consultants: General surgery   Procedures: laparoscopic cholecystectomy     HOSPITAL COURSE:   Acute cholecystitis There was also some concern for choledocholithiasis.  LFTs however were unremarkable.  WBC was noted to be elevated.  Patient was started on ceftriaxone and metronidazole. Patient was seen by general surgery.  Taken to the OR and she underwent laparoscopic cholecystectomy.  Due to the significant inflammation cholangiogram was not performed. Patient did well postoperatively.  LFTs are stable this morning.  WBC however noted to be elevated.  She is afebrile. Seen by general surgery.  They have cleared the patient for discharge and they did not recommend any antibiotics going forward.   Abnormal appearance of  liver Concern for multiple hyperenhancing foci within the liver on CT scan.  Ultrasound did not show any focal lesions.  Will need MRI at some point in time in the near future.  This can be pursued in the outpatient setting.   Essential hypertension/history of PACs Initial elevated blood pressure readings were likely due to acute pain.  Blood pressure has improved.  May resume her home medication from tomorrow.   Diabetes mellitus type 2, controlled On metformin prior to admission.  HbA1c was 5.9 in March.  Patient is stable.  Cleared by general surgery for discharge.  Okay for discharge home today.   PERTINENT LABS:  The results of significant diagnostics from this hospitalization (including imaging, microbiology, ancillary and laboratory) are listed below for reference.    Labs:   Basic Metabolic Panel: Recent Labs  Lab 10/02/21 1425 10/03/21 0356 10/04/21 0434  NA 136 139 136  K 3.7 3.6 4.0  CL 101 107 104  CO2 '24 24 24  '$ GLUCOSE 151* 122* 126*  BUN 8 7* 10  CREATININE 0.68 0.84 0.84  CALCIUM 9.3 9.2 9.1   Liver Function Tests: Recent Labs  Lab 10/02/21 1425 10/03/21 0356 10/04/21 0434  AST '21 20 29  '$ ALT '14 13 21  '$ ALKPHOS 76 63 54  BILITOT 0.5 0.7 0.6  PROT 8.2* 7.0 6.6  ALBUMIN 4.6 3.9 3.4*   Recent Labs  Lab 10/02/21 1425  LIPASE 30    CBC: Recent Labs  Lab 10/02/21 1425 10/03/21 0356 10/04/21 0434  WBC 14.5* 16.2* 22.6*  NEUTROABS 12.2*  --   --   HGB 13.9 13.3 11.2*  HCT 42.4 40.9 34.7*  MCV 92.8 93.2 95.1  PLT 382 297 306    CBG: Recent Labs  Lab 10/03/21 2008 10/03/21 2352 10/04/21 0415 10/04/21 0737 10/04/21 1153  GLUCAP 207* 159* 122* 114* 159*     IMAGING STUDIES US Abdomen Limited RUQ (LIVER/GB)  Result Date: 10/02/2021 CLINICAL DATA:  Pain EXAM: ULTRASOUND ABDOMEN LIMITED RIGHT UPPER QUADRANT COMPARISON:  None Available. FINDINGS: Gallbladder: Edematous and thickened gallbladder wall. Multiple gallstones, largest measures  1.1 cm. Positive sonographic Murphy sign noted by sonographer. Common bile duct: Diameter: 10 mm No intrahepatic biliary ductal dilation. Liver: No focal lesion identified. Within normal limits in parenchymal echogenicity. Portal vein is patent on color Doppler imaging with normal direction of blood flow towards the liver. Other: None. IMPRESSION: 1. Gallbladder wall thickening, gallstones and positive sonographic Murphy sign, findings are consistent with acute cholecystitis. 2. Dilated common bile duct, measuring up to 10 mm, concerning for choledocholithiasis. MRCP could be performed for further evaluation. Electronically Signed   By: Yetta Glassman M.D.   On: 10/02/2021 20:14   CT Abdomen Pelvis W Contrast  Result Date: 10/02/2021 CLINICAL DATA:  "Acute severe pancreatitis".  History of diabetes. EXAM: CT ABDOMEN AND PELVIS WITH CONTRAST TECHNIQUE: Multidetector CT imaging of the abdomen and pelvis was performed using the standard protocol following bolus administration of intravenous contrast. RADIATION DOSE REDUCTION: This exam was performed according to the departmental dose-optimization program which includes automated exposure control, adjustment of the mA and/or kV according to patient size and/or use of iterative reconstruction technique. CONTRAST:  164m OMNIPAQUE IOHEXOL 300 MG/ML  SOLN COMPARISON:  None Available. FINDINGS: Lower chest: Clear lung bases. Normal heart size without pericardial or pleural effusion. Hepatobiliary: Subtle areas of hepatic hyperenhancement including within the high right hepatic lobe at 11 mm on 20/2 and within segment 2 at 9 mm on 24/2. Scattered tiny hepatic cysts. Multiple gallstones with possible gallbladder wall thickening including at 1.0 cm on 39/2. No biliary duct dilatation. Pancreas: The pancreas enhances homogeneously. No convincing evidence of peripancreatic edema. No duct dilatation. Spleen: Normal in size, without focal abnormality. Adrenals/Urinary Tract:  Normal adrenal glands. Too small to characterize interpolar left renal lesion . In the absence of clinically indicated signs/symptoms require(s) no independent follow-up. Normal right kidney. No hydronephrosis. Normal urinary bladder. Stomach/Bowel: Normal stomach, without wall thickening. Extensive colonic diverticulosis. Normal terminal ileum and appendix. Normal small bowel. Vascular/Lymphatic: Aortic atherosclerosis. No abdominopelvic adenopathy. Reproductive: Uterine fibroids, including off the fundus with calcification at 2.4 cm. No adnexal mass. Other: No significant free fluid.  No free intraperitoneal air. Musculoskeletal: Degenerative changes of both hips. IMPRESSION: 1. No evidence of pancreatitis or its complications. 2. Cholelithiasis with possible cholecystitis. Correlate with right upper quadrant symptoms and consider ultrasound 3. Multiple hyperenhancing foci within the liver, nonspecific. Correlate with liver disease and primary malignancy history. Consider nonemergent, outpatient pre and post contrast abdominal MRI. 4. Fibroid uterus 5.  Aortic Atherosclerosis (ICD10-I70.0). Electronically Signed   By: KAbigail MiyamotoM.D.   On: 10/02/2021 18:02    DISCHARGE EXAMINATION: Vitals:   10/03/21 2011 10/03/21 2154 10/04/21 0421 10/04/21 0931  BP: (!) 182/77 (!) 139/49 (!) 109/43 (!) 110/57  Pulse: 89  62 79  Resp: 20  20   Temp: 100.2 F (37.9 C)  98 F (36.7 C)   TempSrc: Oral  Oral   SpO2: 99%  99%   Weight:      Height:       General appearance: Awake alert.  In no distress Resp: Clear to auscultation bilaterally.  Normal effort Cardio: S1-S2 is normal regular.  No S3-S4.  No  rubs murmurs or bruit   DISPOSITION: Home  Discharge Instructions     Call MD for:  difficulty breathing, headache or visual disturbances   Complete by: As directed    Call MD for:  extreme fatigue   Complete by: As directed    Call MD for:  persistant dizziness or light-headedness   Complete by: As  directed    Call MD for:  persistant nausea and vomiting   Complete by: As directed    Call MD for:  severe uncontrolled pain   Complete by: As directed    Call MD for:  temperature >100.4   Complete by: As directed    Diet - low sodium heart healthy   Complete by: As directed    Discharge instructions   Complete by: As directed    Resume your lisinopril and metoprolol tomorrow.  Check your blood pressures at home. Please take your medications as prescribed.  Other instructions as per general surgery.   You were cared for by a hospitalist during your hospital stay. If you have any questions about your discharge medications or the care you received while you were in the hospital after you are discharged, you can call the unit and asked to speak with the hospitalist on call if the hospitalist that took care of you is not available. Once you are discharged, your primary care physician will handle any further medical issues. Please note that NO REFILLS for any discharge medications will be authorized once you are discharged, as it is imperative that you return to your primary care physician (or establish a relationship with a primary care physician if you do not have one) for your aftercare needs so that they can reassess your need for medications and monitor your lab values. If you do not have a primary care physician, you can call [phone] for a physician referral.   Discharge wound care:   Complete by: As directed    As per general surgery.   Increase activity slowly   Complete by: As directed          Allergies as of 10/04/2021   No Known Allergies      Medication List     TAKE these medications    acetaminophen 500 MG tablet Commonly known as: TYLENOL Take 1 tablet (500 mg total) by mouth every 6 (six) hours as needed for mild pain (or Fever >/= 101).   lisinopril 10 MG tablet Commonly known as: ZESTRIL Take 1 tablet (10 mg total) by mouth daily. Start taking on  10/05/2021 Start taking on: October 05, 2021 What changed: additional instructions   metFORMIN 1000 MG tablet Commonly known as: GLUCOPHAGE Take 1 tablet (1,000 mg total) by mouth 2 (two) times daily with a meal.   metoprolol tartrate 50 MG tablet Commonly known as: LOPRESSOR Take 1 tablet (50 mg total) by mouth 2 (two) times daily. Start taking on 10/05/2021 Start taking on: October 05, 2021 What changed: additional instructions   nitroGLYCERIN 0.4 MG SL tablet Commonly known as: NITROSTAT DISSOLVE ONE TABLET UNDER THE TONGUE EVERY 5 MINUTES AS NEEDED FOR CHEST PAIN.  DO NOT EXCEED A TOTAL OF 3 DOSES IN 15 MINUTES What changed: See the new instructions.   oxyCODONE 5 MG immediate release tablet Commonly known as: Oxy IR/ROXICODONE Take 1 tablet (5 mg total) by mouth every 4 (four) hours as needed for severe pain.               Discharge Care Instructions  (  From admission, onward)           Start     Ordered   10/04/21 0000  Discharge wound care:       Comments: As per general surgery.   10/04/21 1034              Follow-up Information     Maczis, Carlena Hurl, PA-C. Go on 10/28/2021.   Specialty: General Surgery Why: at 10:00 AM for post-operative follow up. Please arrive 30 minutes prior to your scheduled appointment. Contact information: [address] [place] [address] [phone]                 TOTAL DISCHARGE TIME: 35 minutes  [facility] Pager on [email or web address]  10/04/2021, 3:44 PM

## 2021-10-04 NOTE — Plan of Care (Signed)
  Problem: Health Behavior/Discharge Planning: Goal: Ability to manage health-related needs will improve Outcome: Progressing   Problem: Health Behavior/Discharge Planning: Goal: Ability to identify and utilize available resources and services will improve Outcome: Progressing Goal: Ability to manage health-related needs will improve Outcome: Progressing   Problem: Elimination: Goal: Will not experience complications related to bowel motility Outcome: Adequate for Discharge   Problem: Pain Managment: Goal: General experience of comfort will improve Outcome: Adequate for Discharge   Problem: Skin Integrity: Goal: Risk for impaired skin integrity will decrease Outcome: Adequate for Discharge   Problem: Clinical Measurements: Goal: Ability to maintain clinical measurements within normal limits will improve Outcome: Adequate for Discharge Goal: Postoperative complications will be avoided or minimized Outcome: Adequate for Discharge   Problem: Skin Integrity: Goal: Demonstration of wound healing without infection will improve Outcome: Adequate for Discharge   Problem: Education: Goal: Knowledge of General Education information will improve Description: Including pain rating scale, medication(s)/side effects and non-pharmacologic comfort measures Outcome: Completed/Met   Problem: Clinical Measurements: Goal: Ability to maintain clinical measurements within normal limits will improve Outcome: Completed/Met Goal: Will remain free from infection Outcome: Completed/Met Goal: Diagnostic test results will improve Outcome: Completed/Met Goal: Respiratory complications will improve Outcome: Completed/Met Goal: Cardiovascular complication will be avoided Outcome: Completed/Met   Problem: Activity: Goal: Risk for activity intolerance will decrease Outcome: Completed/Met   Problem: Nutrition: Goal: Adequate nutrition will be maintained Outcome: Completed/Met   Problem:  Coping: Goal: Level of anxiety will decrease Outcome: Completed/Met   Problem: Elimination: Goal: Will not experience complications related to urinary retention Outcome: Completed/Met   Problem: Safety: Goal: Ability to remain free from injury will improve Outcome: Completed/Met   Problem: Education: Goal: Ability to describe self-care measures that may prevent or decrease complications (Diabetes Survival Skills Education) will improve Outcome: Completed/Met   Problem: Coping: Goal: Ability to adjust to condition or change in health will improve Outcome: Completed/Met   Problem: Fluid Volume: Goal: Ability to maintain a balanced intake and output will improve Outcome: Completed/Met   Problem: Metabolic: Goal: Ability to maintain appropriate glucose levels will improve Outcome: Completed/Met   Problem: Nutritional: Goal: Maintenance of adequate nutrition will improve Outcome: Completed/Met Goal: Progress toward achieving an optimal weight will improve Outcome: Completed/Met   Problem: Skin Integrity: Goal: Risk for impaired skin integrity will decrease Outcome: Completed/Met   Problem: Tissue Perfusion: Goal: Adequacy of tissue perfusion will improve Outcome: Completed/Met   Problem: Education: Goal: Individualized Educational Video(s) Outcome: Not Applicable   Problem: Education: Goal: Required Educational Video(s) Outcome: Not Applicable

## 2021-10-04 NOTE — Progress Notes (Signed)
1 Day Post-Op  Subjective: No acute issues. LFTs remain normal. Pain controlled.    Objective: Vital signs in last 24 hours: Temp:  [97.9 F (36.6 C)-100.2 F (37.9 C)] 98 F (36.7 C) (08/19 0421) Pulse Rate:  [62-106] 62 (08/19 0421) Resp:  [16-20] 20 (08/19 0421) BP: (109-182)/(43-87) 109/43 (08/19 0421) SpO2:  [94 %-100 %] 99 % (08/19 0421) Last BM Date : 10/01/21  Intake/Output from previous day: 08/18 0701 - 08/19 0700 In: 2699.2 [P.O.:720; I.V.:1683; IV Piggyback:296.2] Out: 8786 [Urine:1450; Blood:20] Intake/Output this shift: No intake/output data recorded.  PE: General: resting comfortably, NAD Neuro: alert and oriented, no focal deficits Resp: normal work of breathing on room air Abdomen: soft, nondistended, nontender to palpation. Incisions clean and dry, mild ecchymosis at umbilical incision. Extremities: warm and well-perfused   Lab Results:  Recent Labs    10/03/21 0356 10/04/21 0434  WBC 16.2* 22.6*  HGB 13.3 11.2*  HCT 40.9 34.7*  PLT 297 306   BMET Recent Labs    10/03/21 0356 10/04/21 0434  NA 139 136  K 3.6 4.0  CL 107 104  CO2 24 24  GLUCOSE 122* 126*  BUN 7* 10  CREATININE 0.84 0.84  CALCIUM 9.2 9.1   PT/INR No results for input(s): "LABPROT", "INR" in the last 72 hours. CMP     Component Value Date/Time   NA 136 10/04/2021 0434   K 4.0 10/04/2021 0434   CL 104 10/04/2021 0434   CO2 24 10/04/2021 0434   GLUCOSE 126 (H) 10/04/2021 0434   BUN 10 10/04/2021 0434   CREATININE 0.84 10/04/2021 0434   CREATININE 0.74 11/02/2019 1340   CALCIUM 9.1 10/04/2021 0434   PROT 6.6 10/04/2021 0434   ALBUMIN 3.4 (L) 10/04/2021 0434   AST 29 10/04/2021 0434   ALT 21 10/04/2021 0434   ALKPHOS 54 10/04/2021 0434   BILITOT 0.6 10/04/2021 0434   GFRNONAA >60 10/04/2021 0434   GFRNONAA 88 11/02/2019 1340   GFRAA 102 11/02/2019 1340   Lipase     Component Value Date/Time   LIPASE 30 10/02/2021 1425       Studies/Results: US  Abdomen Limited RUQ (LIVER/GB)  Result Date: 10/02/2021 CLINICAL DATA:  Pain EXAM: ULTRASOUND ABDOMEN LIMITED RIGHT UPPER QUADRANT COMPARISON:  None Available. FINDINGS: Gallbladder: Edematous and thickened gallbladder wall. Multiple gallstones, largest measures 1.1 cm. Positive sonographic Murphy sign noted by sonographer. Common bile duct: Diameter: 10 mm No intrahepatic biliary ductal dilation. Liver: No focal lesion identified. Within normal limits in parenchymal echogenicity. Portal vein is patent on color Doppler imaging with normal direction of blood flow towards the liver. Other: None. IMPRESSION: 1. Gallbladder wall thickening, gallstones and positive sonographic Murphy sign, findings are consistent with acute cholecystitis. 2. Dilated common bile duct, measuring up to 10 mm, concerning for choledocholithiasis. MRCP could be performed for further evaluation. Electronically Signed   By: Yetta Glassman M.D.   On: 10/02/2021 20:14   CT Abdomen Pelvis W Contrast  Result Date: 10/02/2021 CLINICAL DATA:  "Acute severe pancreatitis".  History of diabetes. EXAM: CT ABDOMEN AND PELVIS WITH CONTRAST TECHNIQUE: Multidetector CT imaging of the abdomen and pelvis was performed using the standard protocol following bolus administration of intravenous contrast. RADIATION DOSE REDUCTION: This exam was performed according to the departmental dose-optimization program which includes automated exposure control, adjustment of the mA and/or kV according to patient size and/or use of iterative reconstruction technique. CONTRAST:  143m OMNIPAQUE IOHEXOL 300 MG/ML  SOLN COMPARISON:  None  Available. FINDINGS: Lower chest: Clear lung bases. Normal heart size without pericardial or pleural effusion. Hepatobiliary: Subtle areas of hepatic hyperenhancement including within the high right hepatic lobe at 11 mm on 20/2 and within segment 2 at 9 mm on 24/2. Scattered tiny hepatic cysts. Multiple gallstones with possible  gallbladder wall thickening including at 1.0 cm on 39/2. No biliary duct dilatation. Pancreas: The pancreas enhances homogeneously. No convincing evidence of peripancreatic edema. No duct dilatation. Spleen: Normal in size, without focal abnormality. Adrenals/Urinary Tract: Normal adrenal glands. Too small to characterize interpolar left renal lesion . In the absence of clinically indicated signs/symptoms require(s) no independent follow-up. Normal right kidney. No hydronephrosis. Normal urinary bladder. Stomach/Bowel: Normal stomach, without wall thickening. Extensive colonic diverticulosis. Normal terminal ileum and appendix. Normal small bowel. Vascular/Lymphatic: Aortic atherosclerosis. No abdominopelvic adenopathy. Reproductive: Uterine fibroids, including off the fundus with calcification at 2.4 cm. No adnexal mass. Other: No significant free fluid.  No free intraperitoneal air. Musculoskeletal: Degenerative changes of both hips. IMPRESSION: 1. No evidence of pancreatitis or its complications. 2. Cholelithiasis with possible cholecystitis. Correlate with right upper quadrant symptoms and consider ultrasound 3. Multiple hyperenhancing foci within the liver, nonspecific. Correlate with liver disease and primary malignancy history. Consider nonemergent, outpatient pre and post contrast abdominal MRI. 4. Fibroid uterus 5.  Aortic Atherosclerosis (ICD10-I70.0). Electronically Signed   By: Abigail Miyamoto M.D.   On: 10/02/2021 18:02    Anti-infectives: Anti-infectives (From admission, onward)    Start     Dose/Rate Route Frequency Ordered Stop   10/03/21 2000  cefTRIAXone (ROCEPHIN) 2 g in sodium chloride 0.9 % 100 mL IVPB       See Hyperspace for full Linked Orders Report.   2 g 200 mL/hr over 30 Minutes Intravenous Every 24 hours 10/02/21 2304     10/03/21 0900  metroNIDAZOLE (FLAGYL) IVPB 500 mg       See Hyperspace for full Linked Orders Report.   500 mg 100 mL/hr over 60 Minutes Intravenous Every 12  hours 10/02/21 2304     10/02/21 2030  cefTRIAXone (ROCEPHIN) 2 g in sodium chloride 0.9 % 100 mL IVPB       See Hyperspace for full Linked Orders Report.   2 g 200 mL/hr over 30 Minutes Intravenous  Once 10/02/21 2020 10/02/21 2106   10/02/21 2030  metroNIDAZOLE (FLAGYL) IVPB 500 mg       See Hyperspace for full Linked Orders Report.   500 mg 100 mL/hr over 60 Minutes Intravenous  Once 10/02/21 2020 10/02/21 2138        Assessment/Plan 61 yo female with acute cholecystitis, POD1 s/p laparoscopic cholecystectomy. - Continue regular diet - No need for further antibiotics - Ok for discharge home today from a surgical standpoint. Postop activity restrictions and instructions were reviewed with the patient, and written instructions have been placed in AVS. Patient will follow up in office in 3-4 weeks.     LOS: 2 days    Michaelle Birks, MD [facility] General, Hepatobiliary and Pancreatic Surgery 10/04/21 8:42 AM

## 2021-10-04 NOTE — Progress Notes (Signed)
Pt discharged home today per Dr. [place] Pink. Pt's IV site D/C'd and WDL. Pt's VSS. Pt provided with home medication list, discharge instructions and prescriptions. Verbalized understanding. Pt left floor via WC in stable condition accompanied by RN.

## 2021-10-06 ENCOUNTER — Ambulatory Visit (HOSPITAL_BASED_OUTPATIENT_CLINIC_OR_DEPARTMENT_OTHER): Payer: No Typology Code available for payment source | Admitting: Cardiovascular Disease

## 2021-10-06 LAB — SURGICAL PATHOLOGY

## 2021-10-06 NOTE — Progress Notes (Deleted)
Cardiology Office Note   Date:  10/06/2021   ID:  Raven Weaver, DOB 1960/11/20, MRN [id number]  PCP:  Raven Ruddy, MD  Cardiologist:   Raven Latch, MD   No chief complaint on file.    Patient ID: Raven Weaver is a 61 y.o. female with hypertension, diabetes, PVCs, and prior tobacco abuse who presents to reestablish care.  She was initially seen in 12/2014 when she was noted to have frequent PVCs during a colonoscopy.  Her procedure was cancelled and she was referred to cardiology.  She saw her PCP, Raven Weaver, later that day.  At that time her thyroid function was normal, as was her renal function and electrolytes.  However, her WBC was 16.3 and hemoglobin A1c was 7%.   She was referred for exercise Myoview 12/2014 that was negative for ischemia.  She had an echo 02/2015 that showed LVEF 55-60% and mild AR.  She also had a 24 hour Holter that showed 18% monomorphic PVCs.  She was started on metoprolol 12.5 mg bid.  At the last visit her blood pressure was uncontrolled and metoprolol was increased.  We recommended 32-monthfollow-up but she has not been seen since that time.  She was seen in the hospital in 2019 with chest pain.  During that admission she had an echo that revealed LVEF 55 to 60% and a nuclear stress test that was negative for ischemia.  Ms. WErdmanpresents today for evaluation of chest pain.  Past Medical History:  Diagnosis Date   Abnormal Pap smear of cervix    remote, > 25 years ago, s/p cone bxs, all normal since per her report   Diabetes mellitus without complication ([place]    Essential hypertension 04/08/2015   GERD (gastroesophageal reflux disease)    History of kidney stones    Hyperglycemia    gestational diabetes   Kidney stones    remote, > 30 years ago   PVC (premature ventricular contraction) 12/26/2014   Seasonal allergies 08/07/2014   SVT (supraventricular tachycardia) ([facility] 12/26/2014   Tobacco use     Past Surgical  History:  Procedure Laterality Date   ABLATION     uterine   CESAREAN SECTION     x2   CHOLECYSTECTOMY N/A 10/03/2021   Procedure: LAPAROSCOPIC CHOLECYSTECTOMY;  Surgeon: Raven Kussmaul MD;  Location: [facility];  Service: General;  Laterality: N/A;     Current Outpatient Medications  Medication Sig Dispense Refill   acetaminophen (TYLENOL) 500 MG tablet Take 1 tablet (500 mg total) by mouth every 6 (six) hours as needed for mild pain (or Fever >/= 101).     lisinopril (ZESTRIL) 10 MG tablet Take 1 tablet (10 mg total) by mouth daily. Start taking on 10/05/2021 90 tablet 3   metFORMIN (GLUCOPHAGE) 1000 MG tablet Take 1 tablet (1,000 mg total) by mouth 2 (two) times daily with a meal. 180 tablet 3   metoprolol tartrate (LOPRESSOR) 50 MG tablet Take 1 tablet (50 mg total) by mouth 2 (two) times daily. Start taking on 10/05/2021 180 tablet 3   nitroGLYCERIN (NITROSTAT) 0.4 MG SL tablet DISSOLVE ONE TABLET UNDER THE TONGUE EVERY 5 MINUTES AS NEEDED FOR CHEST PAIN.  DO NOT EXCEED A TOTAL OF 3 DOSES IN 15 MINUTES (Patient taking differently: Place 0.4 mg under the tongue every 5 (five) minutes as needed for chest pain.) 25 tablet 0   oxyCODONE (OXY IR/ROXICODONE) 5 MG immediate release tablet Take 1 tablet (5  mg total) by mouth every 4 (four) hours as needed for severe pain. 30 tablet 0   No current facility-administered medications for this visit.    Allergies:   Patient has no known allergies.    Social History:  The patient  reports that she quit smoking about 7 years ago. Her smoking use included cigarettes. She smoked an average of .25 packs per day. She has never used smokeless tobacco. She reports current alcohol use. She reports that she does not use drugs.   Family History:  The patient's family history includes CAD (age of onset: 73) in her mother; Diabetes in her maternal grandfather and mother; Heart disease (age of onset: 32) in her mother; Hypertension in her maternal grandfather,  maternal grandmother, and mother; Multiple myeloma in her mother; Throat cancer in her paternal grandmother; Uterine cancer in her maternal aunt.    ROS:  Please see the history of present illness.   Otherwise, review of systems are positive for none.   All other systems are reviewed and negative.    PHYSICAL EXAM: VS:  LMP 02/16/2006  , BMI There is no height or weight on file to calculate BMI. GENERAL:  Well appearing HEENT: Pupils equal round and reactive, fundi not visualized, oral mucosa unremarkable NECK:  No jugular venous distention, waveform within normal limits, carotid upstroke brisk and symmetric, no bruits, no thyromegaly  LUNGS:  Clear to auscultation bilaterally HEART:  Mostly regular with frequent ectopy.   PMI not displaced or sustained,S1 and S2 within normal limits, no S3, no S4, no clicks, no rubs, no murmurs ABD:  Flat, positive bowel sounds normal in frequency in pitch, no bruits, no rebound, no guarding, no midline pulsatile mass, no hepatomegaly, no splenomegaly EXT:  2 plus pulses throughout, no edema, no cyanosis no clubbing SKIN:  No rashes no nodules NEURO:  Cranial nerves II through XII grossly intact, motor grossly intact throughout PSYCH:  Cognitively intact, oriented to person place and time   EKG:  EKG is ordered today. 10/23/16: Sinus rhythm. Rate 80 bpm. Frequent PVCs. Ventricular trigeminy.  Exercise Myoview 01/15/15:   There was no ST segment deviation noted during stress. This is a low risk study.   Low risk stress nuclear study with a small, mild, fixed apical defect likely related to apical thinning; no ischemia; study not gated due to ventricular ectopy.  Echo 02/19/15: Study Conclusions  - Left ventricle: The cavity size was normal. There was moderate   concentric hypertrophy. Systolic function was normal. The   estimated ejection fraction was in the range of 55% to 60%. Wall   motion was normal; there were no regional wall motion    abnormalities. - Aortic valve: There was mild regurgitation. - Left atrium: The atrium was mildly dilated. - Atrial septum: No defect or patent foramen ovale was identified.  24 Hour Holter Monitor 02/22/15:  Quality: Fair.  Baseline artifact. Predominant rhythm: sinus rhythm Average heart rate: 96 bpm Max heart rate: 115 bpm Min heart rate: 81 bpm Pauses >2.5 seconds: 0 Ventricular ectopics: 25,201 (4159 isolated, 55 bigemeny, 220,868 trigemeny, 117 quadrigeminy events)   Percentage of ectopic beats: 18.4 Morphology: monomorphic Supraventricular ectopics: 3  Echo 02/2017: Study Conclusions   - Left ventricle: The cavity size was normal. Wall thickness was    normal. Systolic function was normal. The estimated ejection    fraction was in the range of 55% to 60%. Wall motion was normal;    there were no regional wall motion  abnormalities. Features are    consistent with a pseudonormal left ventricular filling pattern,    with concomitant abnormal relaxation and increased filling    pressure (grade 2 diastolic dysfunction).  - Aortic valve: There was mild regurgitation.  - Mitral valve: Mildly thickened leaflets . There was mild    regurgitation.   Lexiscan Myoview 02/2017: IMPRESSION: 1. Decreased radiotracer uptake throughout the endocardial resulting in appearance of transient ischemic dilatation. The TID ratio is 1.36.   2. Left ventricular dilatation with mild global hypokinesis.   3. Left ventricular ejection fraction 44%   4. Non invasive risk stratification*: High  Recent Labs: 05/09/2021: TSH 1.24 10/04/2021: ALT 21; BUN 10; Creatinine, Ser 0.84; Hemoglobin 11.2; Platelets 306; Potassium 4.0; Sodium 136    Lipid Panel    Component Value Date/Time   CHOL 146 05/09/2021 0911   TRIG 75.0 05/09/2021 0911   HDL 52.10 05/09/2021 0911   CHOLHDL 3 05/09/2021 0911   VLDL 15.0 05/09/2021 0911   LDLCALC 79 05/09/2021 0911   LDLCALC 92 11/02/2019 1340      Wt  Readings from Last 3 Encounters:  10/02/21 149 lb 4 oz (67.7 kg)  05/09/21 154 lb (69.9 kg)  12/04/20 155 lb 3.2 oz (70.4 kg)      ASSESSMENT AND PLAN:  # PVCs: Ms. Leas continues to have frequent PVCs.  She does not typically feel any palpitations. We will increase metoprolol to 58m bid.   # Tobacco abuse: Ms. WKutnerwas congratulated on smoking cessation.  # Hypertension:  Blood pressure is above goal both initially on repeat. Continue lisinopril and increase metoprolol to 50 mg twice daily.  # CV Disease Prevention: Check fasting lipids and CMP.   Current medicines are reviewed at length with the patient today.  The patient does not have concerns regarding medicines.  The following changes have been made:  Increase metoprolol to 50 mg twice a day. Labs/ tests ordered today include:   No orders of the defined types were placed in this encounter.   Disposition:   FU with Raven Tulloch C. ROval Linsey MD in 2 months.   Signed, TSkeet Latch MD  10/06/2021 7:32 AM    [facility]

## 2021-11-11 ENCOUNTER — Ambulatory Visit (HOSPITAL_BASED_OUTPATIENT_CLINIC_OR_DEPARTMENT_OTHER): Payer: No Typology Code available for payment source | Admitting: Cardiovascular Disease

## 2021-12-24 ENCOUNTER — Ambulatory Visit (HOSPITAL_BASED_OUTPATIENT_CLINIC_OR_DEPARTMENT_OTHER): Payer: No Typology Code available for payment source | Admitting: Cardiovascular Disease

## 2022-02-27 ENCOUNTER — Ambulatory Visit (HOSPITAL_BASED_OUTPATIENT_CLINIC_OR_DEPARTMENT_OTHER): Payer: No Typology Code available for payment source | Admitting: Cardiovascular Disease

## 2022-03-03 ENCOUNTER — Telehealth: Payer: Self-pay | Admitting: Licensed Clinical Social Worker

## 2022-03-03 NOTE — Telephone Encounter (Signed)
H&V Care Navigation CSW Progress Note  Clinical Social Worker contacted patient by phone to f/u twice as pt is currently uninsured, has cancelled roughly the last 5 scheduled appts with Dr. [name] as per appt notes pt cannot afford appt. No answer either time, left two voicemails. I will mail her Medicaid expansion flyer, my card, [employer] and [employer].   Patient is participating in a Managed Medicaid Plan:  No, self pay currently  [place]: No Food Insecurity (03/11/2021)  Transportation Needs: No Transportation Needs (03/11/2021)  Depression (PHQ2-9): Low Risk  (05/09/2021)  Tobacco Use: Medium Risk (10/04/2021)   Westley Hummer, MSW, [place]  [phone]- work cell phone (preferred) [phone]- desk phone

## 2022-03-04 ENCOUNTER — Telehealth: Payer: Self-pay | Admitting: Licensed Clinical Social Worker

## 2022-03-04 NOTE — Telephone Encounter (Signed)
H&V Care Navigation CSW Progress Note  Clinical Social Worker  received a return call from pt  to f/u on message left. LCSW introduced self, role, reason for call. We discussed cancellation of appt due to cost. Encouraged pt to f/u and apply for Medicaid, also let her know of assistance options including [employer] and [employer]. Mailed items to her home address yesterday and pt confirmed home address and emergency contacts if needed. She will call me when packet received, upcoming appt can complete full assessment if still interested.   Patient is participating in a Managed Medicaid Plan:  No, self pay only.   SDOH Screenings   Food Insecurity: No Food Insecurity (03/11/2021)  Transportation Needs: No Transportation Needs (03/11/2021)  Depression (PHQ2-9): Low Risk  (05/09/2021)  Tobacco Use: Medium Risk (10/04/2021)   Westley Hummer, MSW, [place]  [phone]- work cell phone (preferred) [phone]- desk phone

## 2022-03-05 ENCOUNTER — Other Ambulatory Visit: Payer: Self-pay

## 2022-03-05 DIAGNOSIS — N6314 Unspecified lump in the right breast, lower inner quadrant: Secondary | ICD-10-CM

## 2022-04-09 ENCOUNTER — Ambulatory Visit
Admission: RE | Admit: 2022-04-09 | Discharge: 2022-04-09 | Disposition: A | Discharge status: Home / Self Care | Payer: No Typology Code available for payment source | Source: Ambulatory Visit | Attending: Obstetrics and Gynecology | Admitting: Obstetrics and Gynecology

## 2022-04-09 ENCOUNTER — Ambulatory Visit
Admission: RE | Admit: 2022-04-09 | Discharge: 2022-04-09 | Disposition: A | Discharge status: Home / Self Care | Payer: Self-pay | Source: Ambulatory Visit | Attending: Obstetrics and Gynecology | Admitting: Obstetrics and Gynecology

## 2022-04-09 ENCOUNTER — Ambulatory Visit: Payer: Self-pay | Admitting: Hematology and Oncology

## 2022-04-09 VITALS — BP 169/75 | Wt 149.8 lb

## 2022-04-09 DIAGNOSIS — N6314 Unspecified lump in the right breast, lower inner quadrant: Secondary | ICD-10-CM

## 2022-04-09 DIAGNOSIS — Z1211 Encounter for screening for malignant neoplasm of colon: Secondary | ICD-10-CM

## 2022-04-09 NOTE — Patient Instructions (Signed)
[place] about self breast awareness and gave educational materials to take home. Patient did need a Pap smear today due to last Pap smear was in 2019 per patient. Let her know [facility] will cover Pap smears every 5 years unless has a history of abnormal Pap smears. Referred patient to the [facility] for screening mammogram. Appointment scheduled for 04/09/22. Patient aware of appointment and will be there. Let patient know will follow up with her within the next couple weeks with results. Raven Weaver verbalized understanding.  Melodye Ped, NP 1:29 PM

## 2022-04-09 NOTE — Progress Notes (Signed)
[name] is a 62 y.o. female who presents to [facility] clinic today with no complaints.    Pap Smear: Pap not smear completed today. Last Pap smear was 2021 clinic and was abnormal. ASCUS/ HPV-. Per patient has history of an abnormal Pap smear. Last Pap smear result is available in Epic.    Physical exam: Breasts Breasts symmetrical. No skin abnormalities bilateral breasts. No nipple retraction bilateral breasts. No nipple discharge bilateral breasts. No lymphadenopathy. No lumps palpated bilateral breasts.  MM DIAG BREAST TOMO BILATERAL  Result Date: 04/01/2021 CLINICAL DATA:  Follow-up for probably benign mass in the RIGHT breast. This probably benign finding was initially identified on screening mammogram dated 01/25/2020. EXAM: DIGITAL DIAGNOSTIC BILATERAL MAMMOGRAM WITH TOMOSYNTHESIS AND CAD; ULTRASOUND RIGHT BREAST LIMITED TECHNIQUE: Bilateral digital diagnostic mammography and breast tomosynthesis was performed. The images were evaluated with computer-aided detection.; Targeted ultrasound examination of the right breast was performed COMPARISON:  Previous exam(s). ACR Breast Density Category c: The breast tissue is heterogeneously dense, which may obscure small masses. FINDINGS: The oval circumscribed mass within the inner RIGHT breast appears stable. There are no new dominant masses, suspicious calcifications or secondary signs of malignancy elsewhere within either breast. Targeted ultrasound is performed, again showing an oval circumscribed hypoechoic mass in the RIGHT breast at the 4 o'clock axis, 4 cm from the nipple, measuring 6 x 5 x 5 mm, not significantly changed compared to previous exams, again most likely a benign complicated cyst. IMPRESSION: 1. Probably benign cyst in the RIGHT breast at the 4 o'clock axis, 4 cm from the nipple, measuring 6 mm, not significantly changed compared to previous exams. Recommend additional follow-up diagnostic mammogram, and possible ultrasound,  in 12 months to ensure 2 year stability. 2. No evidence of malignancy within the LEFT breast. RECOMMENDATION: Bilateral diagnostic mammogram, and possible RIGHT breast ultrasound, in 12 months I have discussed the findings and recommendations with the patient. If applicable, a reminder letter will be sent to the patient regarding the next appointment. BI-RADS CATEGORY  3: Probably benign. Electronically Signed   By: Franki Cabot M.D.   On: 04/01/2021 11:19  MS DIGITAL DIAG TOMO BILAT  Result Date: 03/25/2020 CLINICAL DATA:  Screening recall for possible masses in each breast. EXAM: DIGITAL DIAGNOSTIC BILATERAL MAMMOGRAM WITH TOMOSYNTHESIS AND CAD; ULTRASOUND RIGHT BREAST LIMITED TECHNIQUE: Bilateral digital diagnostic mammography and breast tomosynthesis was performed. The images were evaluated with computer-aided detection.; Targeted ultrasound examination of the right breast was performed. COMPARISON:  Screening exam, 01/25/2020. No older studies available. ACR Breast Density Category c: The breast tissue is heterogeneously dense, which may obscure small masses. FINDINGS: On the left, the possible mass noted on the current screening study disperses consistent with superimposed normal fibroglandular tissue. There is no underlying asymmetry and there are no areas of architectural distortion. There are no suspicious calcifications. On the right, the possible mass noted in the lower inner right breast on the current screening study persists as a 6 mm oval mostly circumscribed mass. On physical exam, no mass is palpated in the right breast. Targeted right breast ultrasound is performed, showing a oval, mostly anechoic and mostly circumscribed mass in the right breast at 4 o'clock, 4 cm the nipple, measuring 7 x 5 x 5 mm. There is some increased through transmission of the sound beam and no internal blood flow on color Doppler analysis. IMPRESSION: 1. Probably benign 7 mm mass in the 4 o'clock position of the right  breast, most likely a cyst.  Short-term follow-up recommended. RECOMMENDATION: Right breast ultrasound in 6 months to reassess the 4 o'clock position 7 mm probably benign mass. I have discussed the findings and recommendations with the patient. If applicable, a reminder letter will be sent to the patient regarding the next appointment. BI-RADS CATEGORY  3: Probably benign. Electronically Signed   By: Lajean Manes M.D.   On: 03/25/2020 12:27   MS DIGITAL SCREENING TOMO BILATERAL  Result Date: 02/07/2020 CLINICAL DATA:  Screening. EXAM: DIGITAL SCREENING BILATERAL MAMMOGRAM WITH TOMO AND CAD COMPARISON:  None. ACR Breast Density Category c: The breast tissue is heterogeneously dense, which may obscure small masses. FINDINGS: In the right breast, a possible mass warrants further evaluation. In the left breast, a possible mass warrants further evaluation images were processed with CAD. IMPRESSION: Further evaluation is suggested for possible masses in the right and left breasts. RECOMMENDATION: Diagnostic mammogram and possibly ultrasound of the right and left breasts. (Code:[id number]) The patient will be contacted regarding the findings, and additional imaging will be scheduled. BI-RADS CATEGORY  0: Incomplete. Need additional imaging evaluation and/or prior mammograms for comparison. Electronically Signed   By: Lajean Manes M.D.   On: 02/07/2020 14:56         Pelvic/Bimanual Pap is not indicated today    Smoking History: Patient has is a current smoker at 1.5 packs per day and was referred to quit line.    Patient Navigation: Patient education provided. Access to services provided for patient through [facility] program. No interpreter provided. No transportation provided   Colorectal Cancer Screening: Per patient has never had colonoscopy completed No complaints today. FIT test given.    Breast and Cervical Cancer Risk Assessment: Patient does not have family history of breast cancer, known genetic  mutations, or radiation treatment to the chest before age 80. Patient does not have history of cervical dysplasia, immunocompromised, or DES exposure in-utero.  Risk Assessment   No risk assessment data for the current encounter  Risk Scores       03/11/2021   Last edited by: Demetrius Revel, LPN   5-year risk: 1.5 %   Lifetime risk: 6.5 %            A: [facility]CP exam without pap smear No complaints with benign exam.   P: Referred patient to the [facility] for a screening mammogram. Appointment scheduled 04/09/22.  Melodye Ped, NP 04/09/2022 12:48 PM

## 2022-05-05 ENCOUNTER — Ambulatory Visit (HOSPITAL_BASED_OUTPATIENT_CLINIC_OR_DEPARTMENT_OTHER): Payer: No Typology Code available for payment source | Admitting: Cardiovascular Disease

## 2022-05-26 ENCOUNTER — Ambulatory Visit (INDEPENDENT_AMBULATORY_CARE_PROVIDER_SITE_OTHER): Payer: Self-pay | Admitting: Cardiovascular Disease

## 2022-05-26 ENCOUNTER — Encounter (HOSPITAL_BASED_OUTPATIENT_CLINIC_OR_DEPARTMENT_OTHER): Payer: Self-pay | Admitting: Cardiovascular Disease

## 2022-05-26 VITALS — BP 170/72 | HR 72 | Ht 64.0 in | Wt 150.3 lb

## 2022-05-26 DIAGNOSIS — Z5181 Encounter for therapeutic drug level monitoring: Secondary | ICD-10-CM

## 2022-05-26 DIAGNOSIS — I10 Essential (primary) hypertension: Secondary | ICD-10-CM

## 2022-05-26 DIAGNOSIS — R0789 Other chest pain: Secondary | ICD-10-CM

## 2022-05-26 DIAGNOSIS — I493 Ventricular premature depolarization: Secondary | ICD-10-CM

## 2022-05-26 MED ORDER — VALSARTAN-HYDROCHLOROTHIAZIDE 160-12.5 MG PO TABS: 1.0000 | ORAL_TABLET | Freq: Every day | ORAL | 1 refills | Status: DC

## 2022-05-26 NOTE — Progress Notes (Signed)
Cardiology Office Note   Date:  05/26/2022   ID:  THANH LOEW, DOB 04-21-1960, MRN [id number]  PCP:  Deeann Saint, MD  Cardiologist:   Chilton Si, MD   No chief complaint on file.    Patient ID: Raven Weaver is a 62 y.o. female with diabetes and prior tobacco abuse who presents for follow up. She was initially seen in 12/2014 when she was noted to have frequent PVCs during a colonoscopy.  Her procedure was cancelled and she was referred to cardiology.  She saw her PCP, Dr. Gershon Crane, later that day.  At that time her thyroid function was normal, as was her renal function and electrolytes.  However, her WBC was 16.3 and hemoglobin A1c was 7%.   She was referred for exercise Myoview 12/2014 that was negative for ischemia.  She had an echo 02/2015 that showed LVEF 55-60% and mild AR.  She also had a 24 hour Holter that showed 18% monomorphic PVCs.  She was started on metoprolol 12.5 mg bid.  At her last appointment this was increased due to poorly controlled hypertension. She was requested to repeat her Holter monitor but this did not occur.    She was in the hospital 02/2017 for chest pain. Cardiac enzymes were negative. Repeat nuclear stress revealed LVEF 44% and no ischemia, however EF was 55-65% on echo.   Today, the patient states that she has been doing better. She describes her chest pain as occasional and only a slight discomfort. It occurs randomly and is not associated with activity and it can also not occur for months. She has been getting exercise walking in the neighborhood. 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 She also hasn't been able to sleep recently due to menopause causing sporadic insomnia and hot flashes. Her diet has improved recently, she eats very little fried foods. She has had an intermittent cough and constant rhinitis.  She denies any palpitations, shortness of breath, or  peripheral edema. No lightheadedness, headaches, syncope, orthopnea, or PND.  Past Medical History:  Diagnosis Date   Abnormal Pap smear of cervix    remote, > 25 years ago, s/p cone bxs, all normal since per her report   Diabetes mellitus without complication    Essential hypertension 04/08/2015   GERD (gastroesophageal reflux disease)    History of kidney stones    Hyperglycemia    gestational diabetes   Kidney stones    remote, > 30 years ago   PVC (premature ventricular contraction) 12/26/2014   Seasonal allergies 08/07/2014   SVT (supraventricular tachycardia) 12/26/2014   Tobacco use     Past Surgical History:  Procedure Laterality Date   ABLATION     uterine   CESAREAN SECTION     x2   CHOLECYSTECTOMY N/A 10/03/2021   Procedure: LAPAROSCOPIC CHOLECYSTECTOMY;  Surgeon: Griselda Miner, MD;  Location: [facility];  Service: General;  Laterality: N/A;     Current Outpatient Medications  Medication Sig Dispense Refill   acetaminophen (TYLENOL) 500 MG tablet Take 1 tablet (500 mg total) by mouth every 6 (six) hours as needed for mild pain (or Fever >/= 101).     metFORMIN (GLUCOPHAGE) 1000 MG tablet Take 1 tablet (1,000 mg total) by mouth 2 (two) times daily with a meal. 180 tablet 3   metoprolol tartrate (LOPRESSOR) 50 MG tablet Take 1 tablet (50 mg total) by mouth  2 (two) times daily. Start taking on 10/05/2021 180 tablet 3   nitroGLYCERIN (NITROSTAT) 0.4 MG SL tablet DISSOLVE ONE TABLET UNDER THE TONGUE EVERY 5 MINUTES AS NEEDED FOR CHEST PAIN.  DO NOT EXCEED A TOTAL OF 3 DOSES IN 15 MINUTES (Patient taking differently: Place 0.4 mg under the tongue every 5 (five) minutes as needed for chest pain.) 25 tablet 0   valsartan-hydrochlorothiazide (DIOVAN-HCT) 160-12.5 MG tablet Take 1 tablet by mouth daily. 90 tablet 1   No current facility-administered medications for this visit.    Allergies:   Patient has no known allergies.    Social History:  The patient  reports that she quit  smoking about 7 years ago. Her smoking use included cigarettes. She smoked an average of .5 packs per day. She has never used smokeless tobacco. She reports current alcohol use. She reports that she does not use drugs.   Family History:  The patient's family history includes CAD (age of onset: 31) in her mother; Diabetes in her maternal grandfather and mother; Heart disease (age of onset: 13) in her mother; Hypertension in her maternal grandfather, maternal grandmother, and mother; Multiple myeloma in her mother; Throat cancer in her paternal grandmother; Uterine cancer in her maternal aunt.    ROS:  Please see the history of present illness.   (+) Hot Flashes (+) Insomnia (+) Chest discomfort (+) Cough (+) Rhinitis All other systems are reviewed and negative.    PHYSICAL EXAM: VS:  BP (!) 170/72 (BP Location: Left Arm, Patient Position: Sitting, Cuff Size: Normal)   Pulse 72   Ht 5\' 4"  (1.626 m)   Wt 150 lb 4.8 oz (68.2 kg)   LMP 02/16/2006   BMI 25.80 kg/m  , BMI Body mass index is 25.8 kg/m. GENERAL:  Well appearing HEENT: Pupils equal round and reactive, fundi not visualized, oral mucosa unremarkable NECK:  No jugular venous distention, waveform within normal limits, carotid upstroke brisk and symmetric, no bruits, no thyromegaly  LUNGS:  Clear to auscultation bilaterally HEART:  Mostly regular with frequent ectopy.   PMI not displaced or sustained,S1 and S2 within normal limits, no S3, no S4, no clicks, no rubs, no murmurs ABD:  Flat, positive bowel sounds normal in frequency in pitch, no bruits, no rebound, no guarding, no midline pulsatile mass, no hepatomegaly, no splenomegaly EXT:  2 plus pulses throughout, no edema, no cyanosis no clubbing SKIN:  No rashes no nodules NEURO:  Cranial nerves II through XII grossly intact, motor grossly intact throughout PSYCH:  Cognitively intact, oriented to person place and time   EKG:  EKG is personally reviewed 05/26/2022: NSR 72bpm.  PVC 10/23/16: Sinus rhythm. Rate 80 bpm. Frequent PVCs. Ventricular trigeminy.  Exercise Myoview 01/15/15:   There was no ST segment deviation noted during stress. This is a low risk study.   Low risk stress nuclear study with a small, mild, fixed apical defect likely related to apical thinning; no ischemia; study not gated due to ventricular ectopy.  Echo 02/19/15: Study Conclusions  - Left ventricle: The cavity size was normal. There was moderate   concentric hypertrophy. Systolic function was normal. The   estimated ejection fraction was in the range of 55% to 60%. Wall   motion was normal; there were no regional wall motion   abnormalities. - Aortic valve: There was mild regurgitation. - Left atrium: The atrium was mildly dilated. - Atrial septum: No defect or patent foramen ovale was identified.  24 Hour Holter Monitor 02/22/15:  Quality: Fair.  Baseline artifact. Predominant rhythm: sinus rhythm Average heart rate: 96 bpm Max heart rate: 115 bpm Min heart rate: 81 bpm Pauses >2.5 seconds: 0 Ventricular ectopics: 25,201 (4159 isolated, 55 bigemeny, 220,868 trigemeny, 117 quadrigeminy events)   Percentage of ectopic beats: 18.4 Morphology: monomorphic Supraventricular ectopics: 3  Recent Labs: 10/04/2021: ALT 21; BUN 10; Creatinine, Ser 0.84; Hemoglobin 11.2; Platelets 306; Potassium 4.0; Sodium 136    Lipid Panel    Component Value Date/Time   CHOL 146 05/09/2021 0911   TRIG 75.0 05/09/2021 0911   HDL 52.10 05/09/2021 0911   CHOLHDL 3 05/09/2021 0911   VLDL 15.0 05/09/2021 0911   LDLCALC 79 05/09/2021 0911   LDLCALC 92 11/02/2019 1340      Wt Readings from Last 3 Encounters:  05/26/22 150 lb 4.8 oz (68.2 kg)  04/09/22 149 lb 12.8 oz (67.9 kg)  10/02/21 149 lb 4 oz (67.7 kg)      ASSESSMENT AND PLAN:  PVC (premature ventricular contraction) She had 1 PVC on EKG today but is asymptomatic.  Continue metoprolol at current dosing.  Essential  hypertension Blood pressure is uncontrolled both initially and on repeat.  She is doing good job of exercising and reports that her diet has been very good.  She does have a chronic cough that may be attributable to her lisinopril.  However given that it does go away after treatment of seasonal allergies, I will not listed as a drug allergy.  We will switch lisinopril to valsartan/HCTZ at 160/12.5 mg daily.  She has an appointment with her PCP next month and will have labs drawn at that time.  She will check her blood pressures and bring to follow-up with her PCP.  Her goal is less than 130/80.  Tobacco use disorder In remission  Atypical chest pain Her chest pain remains very atypical.  She is exercising regularly and has no symptoms.  No plan for any repeat ischemic evaluation at this time.    Current medicines are reviewed at length with the patient today.  The patient does not have concerns regarding medicines.  The following changes have been made:  Increase metoprolol to 50 mg twice a day. Labs/ tests ordered today include:   Orders Placed This Encounter  Procedures   Basic metabolic panel   EKG 12-Lead    Disposition:   FU with Ketura Sirek C. Duke Salviaandolph, MD in 1 year.  I,Coren O'Brien,acting as a Neurosurgeonscribe for [employer] [place], MD.,have documented all relevant documentation on the behalf of Chilton Siiffany [place], MD,as directed by  Chilton Siiffany [place], MD while in the presence of Chilton Siiffany [place], MD.  I, Kaleen Rochette C. Duke Salviaandolph, MD have reviewed all documentation for this visit.  The documentation of the exam, diagnosis, procedures, and orders on 05/26/2022 are all accurate and complete.

## 2022-05-26 NOTE — Assessment & Plan Note (Signed)
Blood pressure is uncontrolled both initially and on repeat.  She is doing good job of exercising and reports that her diet has been very good.  She does have a chronic cough that may be attributable to her lisinopril.  However given that it does go away after treatment of seasonal allergies, I will not listed as a drug allergy.  We will switch lisinopril to valsartan/HCTZ at 160/12.5 mg daily.  She has an appointment with her PCP next month and will have labs drawn at that time.  She will check her blood pressures and bring to follow-up with her PCP.  Her goal is less than 130/80.

## 2022-05-26 NOTE — Assessment & Plan Note (Signed)
Her chest pain remains very atypical.  She is exercising regularly and has no symptoms.  No plan for any repeat ischemic evaluation at this time.

## 2022-05-26 NOTE — Assessment & Plan Note (Signed)
In remission.

## 2022-05-26 NOTE — Patient Instructions (Signed)
Medication Instructions:  STOP LISINOPRIL   START VALSARTAN HCT 160-12.5 MG DAILY   *If you need a refill on your cardiac medications before your next appointment, please call your pharmacy*  Lab Work: BMET AT YOUR PRIMARY NEXT MONTH   If you have labs (blood work) drawn today and your tests are completely normal, you will receive your results only by: MyChart Message (if you have MyChart) OR A paper copy in the mail If you have any lab test that is abnormal or we need to change your treatment, we will call you to review the results.  Testing/Procedures: NONE  Follow-Up: At [facility], you and your health needs are our priority.  As part of our continuing mission to provide you with exceptional heart care, we have created designated Provider Care Teams.  These Care Teams include your primary Cardiologist (physician) and Advanced Practice Providers (APPs -  Physician Assistants and Nurse Practitioners) who all work together to provide you with the care you need, when you need it.  We recommend signing up for the patient portal called "MyChart".  Sign up information is provided on this After Visit Summary.  MyChart is used to connect with patients for Virtual Visits (Telemedicine).  Patients are able to view lab/test results, encounter notes, upcoming appointments, etc.  Non-urgent messages can be sent to your provider as well.   To learn more about what you can do with MyChart, go to [email or web address].    Your next appointment:   12 month(s)  Provider:   Chilton Si, MD or Gillian Shields, NP    Other Instructions MONITOR AND LOG YOUR BLOOD PRESSURE. TAKE YOUR READINGS AND MACHINE TO FOLLOW UP WITH PRIMARY CARE

## 2022-05-26 NOTE — Assessment & Plan Note (Signed)
She had 1 PVC on EKG today but is asymptomatic.  Continue metoprolol at current dosing.

## 2022-05-27 ENCOUNTER — Telehealth: Payer: Self-pay | Admitting: Licensed Clinical Social Worker

## 2022-05-27 NOTE — Telephone Encounter (Signed)
H&V Care Navigation CSW Progress Note  Clinical Social Worker contacted patient by phone to f/u after appt with Dr. Duke Salvia. Pt previously had cancelled appts due to cost. Still noted as uninsured. LCSW had previously sent assistance applications. Pt did not answer today left voicemail offering assistance and my contact information, will re-attempt one more time.  Patient is participating in a Managed Medicaid Plan:  No, self pay only  SDOH Screenings   Food Insecurity: No Food Insecurity (04/09/2022)  Transportation Needs: No Transportation Needs (04/09/2022)  Depression (PHQ2-9): Low Risk  (05/09/2021)  Tobacco Use: Medium Risk (05/26/2022)   Octavio Graves, MSW, LCSW Clinical Social Worker II [facility] Heart/Vascular Care Navigation  [phone]- work cell phone (preferred) [phone]- desk phone

## 2022-05-28 ENCOUNTER — Telehealth: Payer: Self-pay | Admitting: Licensed Clinical Social Worker

## 2022-05-28 NOTE — Telephone Encounter (Signed)
H&V Care Navigation CSW Progress Note  Clinical Social Worker contacted patient by phone to f/u after appt with Dr. Duke Salvia. Pt previously had cancelled appts due to cost. Still noted as uninsured. LCSW had previously sent assistance applications. Pt did not answer today left voicemail offering assistance and my contact information.   Patient is participating in a Managed Medicaid Plan:  No, self pay only  SDOH Screenings   Food Insecurity: No Food Insecurity (04/09/2022)  Transportation Needs: No Transportation Needs (04/09/2022)  Depression (PHQ2-9): Low Risk  (05/09/2021)  Tobacco Use: Medium Risk (05/26/2022)   Octavio Graves, MSW, LCSW Clinical Social Worker II [facility] Heart/Vascular Care Navigation  [phone]- work cell phone (preferred) [phone]- desk phone

## 2022-06-05 ENCOUNTER — Telehealth: Payer: Self-pay | Admitting: Licensed Clinical Social Worker

## 2022-06-05 NOTE — Telephone Encounter (Signed)
H&V Care Navigation CSW Progress Note  Clinical Social Worker contacted patient by phone to f/u after appt with Dr. Duke Salvia. Pt previously had cancelled appts due to cost. Still noted as uninsured. LCSW had previously sent assistance applications. Pt did not answer again today for a third time, left voicemail offering assistance and my contact information. I have mailed pt another copy of [employer], blank letter of support and my card.    Patient is participating in a Managed Medicaid Plan:  No, self pay only  SDOH Screenings   Food Insecurity: No Food Insecurity (04/09/2022)  Transportation Needs: No Transportation Needs (04/09/2022)  Depression (PHQ2-9): Low Risk  (05/09/2021)  Tobacco Use: Medium Risk (05/26/2022)   Octavio Graves, MSW, LCSW Clinical Social Worker II [facility] Heart/Vascular Care Navigation  [phone]- work cell phone (preferred) [phone]- desk phone

## 2022-06-21 ENCOUNTER — Other Ambulatory Visit: Payer: Self-pay | Admitting: Family Medicine

## 2022-06-21 DIAGNOSIS — E1169 Type 2 diabetes mellitus with other specified complication: Secondary | ICD-10-CM

## 2022-07-26 ENCOUNTER — Other Ambulatory Visit: Payer: Self-pay | Admitting: Family Medicine

## 2022-07-26 DIAGNOSIS — I10 Essential (primary) hypertension: Secondary | ICD-10-CM

## 2022-11-20 ENCOUNTER — Other Ambulatory Visit: Payer: Self-pay | Admitting: Cardiovascular Disease

## 2022-12-07 ENCOUNTER — Telehealth: Payer: Self-pay | Admitting: Family Medicine

## 2022-12-07 DIAGNOSIS — E1169 Type 2 diabetes mellitus with other specified complication: Secondary | ICD-10-CM

## 2022-12-07 MED ORDER — METFORMIN HCL 1000 MG PO TABS: 1000.0000 mg | ORAL_TABLET | Freq: Two times a day (BID) | ORAL | 0 refills | Status: DC

## 2022-12-07 NOTE — Telephone Encounter (Signed)
Prescription Request  12/07/2022  LOV: Visit date not found  What is the name of the medication or equipment? metFORMIN (GLUCOPHAGE) 1000 MG tablet  Have you contacted your pharmacy to request a refill? No   Which pharmacy would you like this sent to?  [facility] [address], [place] - [address] [place] [address] Phone: [phone] Fax: [phone]    Patient notified that their request is being sent to the clinical staff for review and that they should receive a response within 2 business days.   Please advise at Mobile [phone] (mobile)

## 2022-12-24 ENCOUNTER — Encounter: Payer: Self-pay | Admitting: Family Medicine

## 2022-12-27 ENCOUNTER — Other Ambulatory Visit (HOSPITAL_BASED_OUTPATIENT_CLINIC_OR_DEPARTMENT_OTHER): Payer: Self-pay | Admitting: Cardiovascular Disease

## 2023-01-06 ENCOUNTER — Ambulatory Visit (INDEPENDENT_AMBULATORY_CARE_PROVIDER_SITE_OTHER): Payer: Self-pay | Admitting: Family Medicine

## 2023-01-06 ENCOUNTER — Encounter: Payer: Self-pay | Admitting: Family Medicine

## 2023-01-06 VITALS — BP 132/76 | HR 76 | Temp 98.7°F | Ht 64.0 in | Wt 158.4 lb

## 2023-01-06 DIAGNOSIS — Z0000 Encounter for general adult medical examination without abnormal findings: Secondary | ICD-10-CM

## 2023-01-06 DIAGNOSIS — I493 Ventricular premature depolarization: Secondary | ICD-10-CM

## 2023-01-06 DIAGNOSIS — E1169 Type 2 diabetes mellitus with other specified complication: Secondary | ICD-10-CM

## 2023-01-06 DIAGNOSIS — I10 Essential (primary) hypertension: Secondary | ICD-10-CM

## 2023-01-06 DIAGNOSIS — Z7984 Long term (current) use of oral hypoglycemic drugs: Secondary | ICD-10-CM

## 2023-01-06 LAB — POCT GLYCOSYLATED HEMOGLOBIN (HGB A1C): Hemoglobin A1C: 6.7 % — AB (ref 4.0–5.6)

## 2023-01-06 MED ORDER — METOPROLOL TARTRATE 50 MG PO TABS: 50.0000 mg | ORAL_TABLET | Freq: Two times a day (BID) | ORAL | 2 refills | Status: AC

## 2023-01-06 MED ORDER — METFORMIN HCL 1000 MG PO TABS: 1000.0000 mg | ORAL_TABLET | Freq: Two times a day (BID) | ORAL | 2 refills | Status: DC

## 2023-01-06 NOTE — Progress Notes (Signed)
Established Patient Office Visit   Subjective  Patient ID: Raven Weaver, female    DOB: 17-Apr-1960  Age: 62 y.o. MRN: [id number]  Chief Complaint  Patient presents with   Annual Exam    Patient is a 62 year old female seen for CPE and refills.  Requesting refills on metformin and metoprolol.  Pt currently without insurance. Hopes to have coverage in a few months. Plans to do stool cards colon cancer screening from the health department.  States exercising by walking.  Eating about the same things daily.  Previously seen by cardiology for palpitations.  Not really noticing them.  States BP at home is good, does not recall readings.    Patient Active Problem List   Diagnosis Date Noted   Choledocholithiasis with acute cholecystitis 10/02/2021   Atypical chest pain    Type 2 diabetes mellitus without complication, without long-term current use of insulin (HCC) 08/12/2015   Essential hypertension 04/08/2015   PVC (premature ventricular contraction) 12/26/2014   Seasonal allergies 08/07/2014   GERD (gastroesophageal reflux disease) 08/07/2014   Past Medical History:  Diagnosis Date   Abnormal Pap smear of cervix    remote, > 25 years ago, s/p cone bxs, all normal since per her report   Diabetes mellitus without complication (HCC)    Essential hypertension 04/08/2015   GERD (gastroesophageal reflux disease)    History of kidney stones    Hyperglycemia    gestational diabetes   Kidney stones    remote, > 30 years ago   PVC (premature ventricular contraction) 12/26/2014   Seasonal allergies 08/07/2014   SVT (supraventricular tachycardia) (HCC) 12/26/2014   Tobacco use    Past Surgical History:  Procedure Laterality Date   ABLATION     uterine   CESAREAN SECTION     x2   CHOLECYSTECTOMY N/A 10/03/2021   Procedure: LAPAROSCOPIC CHOLECYSTECTOMY;  Surgeon: Griselda Miner, MD;  Location: [facility];  Service: General;  Laterality: N/A;   Social History   Tobacco Use   Smoking  status: Former    Current packs/day: 0.00    Types: Cigarettes    Quit date: 06/25/2014    Years since quitting: 8.5   Smokeless tobacco: Never   Tobacco comments:    per patient quit 8 weeks ago  Vaping Use   Vaping status: Never Used  Substance Use Topics   Alcohol use: Yes    Comment: 1 glass every 2 weeks   Drug use: No   Family History  Problem Relation Age of Onset   Hypertension Mother    Diabetes Mother    Heart disease Mother 74       MI   CAD Mother 69   Multiple myeloma Mother    Uterine cancer Maternal Aunt    Diabetes Maternal Grandfather    Hypertension Maternal Grandfather    Throat cancer Paternal Grandmother    Hypertension Maternal Grandmother    Colon cancer Neg Hx    No Known Allergies    ROS Negative unless stated above    Objective:     BP 132/76 (BP Location: Left Arm, Patient Position: Sitting, Cuff Size: Normal)   Pulse 76   Temp 98.7 F (37.1 C) (Oral)   Ht 5\' 4"  (1.626 m)   Wt 158 lb 6.4 oz (71.8 kg)   LMP 02/16/2006   SpO2 99%   BMI 27.19 kg/m  BP Readings from Last 3 Encounters:  01/06/23 132/76  05/26/22 (!) 170/72  04/09/22 (!) 169/75  Wt Readings from Last 3 Encounters:  01/06/23 158 lb 6.4 oz (71.8 kg)  05/26/22 150 lb 4.8 oz (68.2 kg)  04/09/22 149 lb 12.8 oz (67.9 kg)      Physical Exam Constitutional:      Appearance: Normal appearance.  HENT:     Head: Normocephalic and atraumatic.     Right Ear: Tympanic membrane, ear canal and external ear normal.     Left Ear: Tympanic membrane, ear canal and external ear normal.     Nose: Nose normal.     Mouth/Throat:     Mouth: Mucous membranes are moist.     Pharynx: No oropharyngeal exudate or posterior oropharyngeal erythema.  Eyes:     General: No scleral icterus.    Extraocular Movements: Extraocular movements intact.     Conjunctiva/sclera: Conjunctivae normal.     Pupils: Pupils are equal, round, and reactive to light.  Neck:     Thyroid: No thyromegaly.   Cardiovascular:     Rate and Rhythm: Normal rate. Rhythm irregular.     Pulses: Normal pulses.     Heart sounds: Normal heart sounds. No murmur heard.    No friction rub.  Pulmonary:     Effort: Pulmonary effort is normal.     Breath sounds: Normal breath sounds. No wheezing, rhonchi or rales.  Abdominal:     General: Bowel sounds are normal.     Palpations: Abdomen is soft.     Tenderness: There is no abdominal tenderness.  Musculoskeletal:        General: No deformity. Normal range of motion.  Lymphadenopathy:     Cervical: No cervical adenopathy.  Skin:    General: Skin is warm and dry.     Findings: No lesion.  Neurological:     General: No focal deficit present.     Mental Status: She is alert and oriented to person, place, and time.  Psychiatric:        Attention and Perception: Attention normal.        Mood and Affect: Mood normal.        Thought Content: Thought content normal.     Comments: Somewhat cooperative.      Results for orders placed or performed in visit on 01/06/23  POC HgB A1c  Result Value Ref Range   Hemoglobin A1C 6.7 (A) 4.0 - 5.6 %   HbA1c POC (<> result, manual entry)     HbA1c, POC (prediabetic range)     HbA1c, POC (controlled diabetic range)        Assessment & Plan:  Well adult exam -     Lipid panel; Future -     TSH; Future  Type 2 diabetes mellitus with other specified complication, without long-term current use of insulin (HCC) -     CBC with Differential/Platelet; Future -     Lipid panel; Future -     POCT glycosylated hemoglobin (Hb A1C) -     metFORMIN HCl; Take 1 tablet (1,000 mg total) by mouth 2 (two) times daily with a meal.  Dispense: 90 tablet; Refill: 2  Essential hypertension -     CBC with Differential/Platelet; Future -     Comprehensive metabolic panel; Future -     Lipid panel; Future -     TSH; Future -     T4, free; Future -     Metoprolol Tartrate; Take 1 tablet (50 mg total) by mouth 2 (two) times  daily.  Dispense: 180 tablet;  Refill: 2  PVC's (premature ventricular contractions) -     Comprehensive metabolic panel; Future -     TSH; Future -     T4, free; Future  Age-appropriate health screenings discussed.  Discussed obtaining labs.  Patient wishes to wait at this time.  Agrees to POC A1c.  Last A1c 5.9% on 05/09/2021.  Immunizations reviewed.  Needs foot exam and eye exam.  Discussed colon cancer screening.  Declines colonoscopy or Cologuard.  No follow-ups on file.   Deeann Saint, MD

## 2023-04-28 ENCOUNTER — Other Ambulatory Visit (HOSPITAL_BASED_OUTPATIENT_CLINIC_OR_DEPARTMENT_OTHER): Payer: Self-pay | Admitting: Cardiovascular Disease

## 2023-05-07 ENCOUNTER — Other Ambulatory Visit: Payer: Self-pay | Admitting: Cardiovascular Disease

## 2023-06-10 ENCOUNTER — Other Ambulatory Visit (HOSPITAL_BASED_OUTPATIENT_CLINIC_OR_DEPARTMENT_OTHER): Payer: Self-pay | Admitting: Cardiovascular Disease

## 2023-08-27 ENCOUNTER — Other Ambulatory Visit: Payer: Self-pay | Admitting: Family Medicine

## 2023-08-27 DIAGNOSIS — E1169 Type 2 diabetes mellitus with other specified complication: Secondary | ICD-10-CM
